# Patient Record
Sex: Male | Born: 1970 | Race: Black or African American | Hispanic: No | Marital: Single | Smoking: Current every day smoker
Health system: Southern US, Community
[De-identification: ages and names within clinical notes are randomized; demographics above are authoritative.]

## PROBLEM LIST (undated history)

## (undated) DIAGNOSIS — F419 Anxiety disorder, unspecified: Secondary | ICD-10-CM

## (undated) DIAGNOSIS — I10 Essential (primary) hypertension: Secondary | ICD-10-CM

## (undated) HISTORY — PX: OTHER SURGICAL HISTORY: SHX169

---

## 2018-04-16 ENCOUNTER — Inpatient Hospital Stay
Admission: EM | Admit: 2018-04-16 | Discharge: 2018-04-19 | DRG: 897 | Disposition: A | Discharge status: Home / Self Care | Payer: Self-pay | Attending: Internal Medicine | Admitting: Internal Medicine

## 2018-04-16 ENCOUNTER — Other Ambulatory Visit: Payer: Self-pay

## 2018-04-16 DIAGNOSIS — F10231 Alcohol dependence with withdrawal delirium: Principal | ICD-10-CM | POA: Diagnosis present

## 2018-04-16 DIAGNOSIS — F17290 Nicotine dependence, other tobacco product, uncomplicated: Secondary | ICD-10-CM | POA: Diagnosis present

## 2018-04-16 DIAGNOSIS — Z79899 Other long term (current) drug therapy: Secondary | ICD-10-CM

## 2018-04-16 DIAGNOSIS — F10939 Alcohol use, unspecified with withdrawal, unspecified: Secondary | ICD-10-CM

## 2018-04-16 DIAGNOSIS — I10 Essential (primary) hypertension: Secondary | ICD-10-CM | POA: Diagnosis present

## 2018-04-16 DIAGNOSIS — R000 Tachycardia, unspecified: Secondary | ICD-10-CM | POA: Diagnosis present

## 2018-04-16 DIAGNOSIS — M6282 Rhabdomyolysis: Secondary | ICD-10-CM

## 2018-04-16 DIAGNOSIS — F419 Anxiety disorder, unspecified: Secondary | ICD-10-CM | POA: Diagnosis present

## 2018-04-16 DIAGNOSIS — E876 Hypokalemia: Secondary | ICD-10-CM | POA: Diagnosis present

## 2018-04-16 DIAGNOSIS — R443 Hallucinations, unspecified: Secondary | ICD-10-CM | POA: Diagnosis present

## 2018-04-16 DIAGNOSIS — F10239 Alcohol dependence with withdrawal, unspecified: Secondary | ICD-10-CM | POA: Diagnosis present

## 2018-04-16 DIAGNOSIS — Z7982 Long term (current) use of aspirin: Secondary | ICD-10-CM

## 2018-04-16 DIAGNOSIS — Z560 Unemployment, unspecified: Secondary | ICD-10-CM

## 2018-04-16 HISTORY — DX: Essential (primary) hypertension: I10

## 2018-04-16 HISTORY — DX: Anxiety disorder, unspecified: F41.9

## 2018-04-16 LAB — URINALYSIS, COMPLETE (UACMP) WITH MICROSCOPIC
BACTERIA UA: NONE SEEN
BILIRUBIN URINE: NEGATIVE
GLUCOSE, UA: NEGATIVE mg/dL
HGB URINE DIPSTICK: NEGATIVE
KETONES UR: NEGATIVE mg/dL
LEUKOCYTES UA: NEGATIVE
Nitrite: NEGATIVE
Protein, ur: NEGATIVE mg/dL
SQUAMOUS EPITHELIAL / LPF: NONE SEEN (ref 0–5)
Specific Gravity, Urine: 1.003 — ABNORMAL LOW (ref 1.005–1.030)
WBC UA: NONE SEEN WBC/hpf (ref 0–5)
pH: 6 (ref 5.0–8.0)

## 2018-04-16 LAB — URINE DRUG SCREEN, QUALITATIVE (ARMC ONLY)
Amphetamines, Ur Screen: NOT DETECTED
BARBITURATES, UR SCREEN: NOT DETECTED
Benzodiazepine, Ur Scrn: NOT DETECTED
COCAINE METABOLITE, UR ~~LOC~~: NOT DETECTED
Cannabinoid 50 Ng, Ur ~~LOC~~: NOT DETECTED
MDMA (ECSTASY) UR SCREEN: NOT DETECTED
METHADONE SCREEN, URINE: NOT DETECTED
Opiate, Ur Screen: NOT DETECTED
Phencyclidine (PCP) Ur S: NOT DETECTED
Tricyclic, Ur Screen: NOT DETECTED

## 2018-04-16 LAB — COMPREHENSIVE METABOLIC PANEL
ALBUMIN: 4.5 g/dL (ref 3.5–5.0)
ALT: 53 U/L (ref 17–63)
AST: 103 U/L — AB (ref 15–41)
Alkaline Phosphatase: 89 U/L (ref 38–126)
Anion gap: 14 (ref 5–15)
BUN: 9 mg/dL (ref 6–20)
CHLORIDE: 94 mmol/L — AB (ref 101–111)
CO2: 23 mmol/L (ref 22–32)
Calcium: 8.5 mg/dL — ABNORMAL LOW (ref 8.9–10.3)
Creatinine, Ser: 0.87 mg/dL (ref 0.61–1.24)
GFR calc Af Amer: >60 mL/min (ref >60)
GLUCOSE: 116 mg/dL — AB (ref 65–99)
POTASSIUM: 3 mmol/L — AB (ref 3.5–5.1)
Sodium: 131 mmol/L — ABNORMAL LOW (ref 135–145)
Total Bilirubin: 0.6 mg/dL (ref 0.3–1.2)
Total Protein: 8.6 g/dL — ABNORMAL HIGH (ref 6.5–8.1)

## 2018-04-16 LAB — CBC
HCT: 41.9 % (ref 40.0–52.0)
HEMOGLOBIN: 14.3 g/dL (ref 13.0–18.0)
MCH: 30.4 pg (ref 26.0–34.0)
MCHC: 34.1 g/dL (ref 32.0–36.0)
MCV: 88.9 fL (ref 80.0–100.0)
Platelets: 139 10*3/uL — ABNORMAL LOW (ref 150–440)
RBC: 4.71 MIL/uL (ref 4.40–5.90)
RDW: 15.3 % — ABNORMAL HIGH (ref 11.5–14.5)
WBC: 6.6 10*3/uL (ref 3.8–10.6)

## 2018-04-16 LAB — CK: CK TOTAL: 686 U/L — AB (ref 49–397)

## 2018-04-16 LAB — ETHANOL: ALCOHOL ETHYL (B): 67 mg/dL — AB (ref <10)

## 2018-04-16 LAB — MAGNESIUM: Magnesium: 1.6 mg/dL — ABNORMAL LOW (ref 1.7–2.4)

## 2018-04-16 MED ORDER — SODIUM CHLORIDE 0.9 % IV BOLUS
1000.0000 mL | Freq: Once | INTRAVENOUS | Status: AC
  Administered 2018-04-16: 1000 mL via INTRAVENOUS

## 2018-04-16 MED ORDER — LORAZEPAM 1 MG PO TABS: 1.0000 mg | ORAL_TABLET | Freq: Four times a day (QID) | ORAL | Status: AC | PRN

## 2018-04-16 MED ORDER — ONDANSETRON HCL 4 MG/2ML IJ SOLN: 4.0000 mg | Freq: Four times a day (QID) | INTRAMUSCULAR | Status: DC | PRN

## 2018-04-16 MED ORDER — LORAZEPAM 2 MG/ML IJ SOLN
2.0000 mg | Freq: Once | INTRAMUSCULAR | Status: AC
  Administered 2018-04-16: 2 mg via INTRAVENOUS

## 2018-04-16 MED ORDER — POTASSIUM CHLORIDE IN NACL 20-0.9 MEQ/L-% IV SOLN
INTRAVENOUS | Status: DC
  Administered 2018-04-16 – 2018-04-18 (×3): via INTRAVENOUS
  Filled 2018-04-16 (×7): qty 1000

## 2018-04-16 MED ORDER — SENNOSIDES-DOCUSATE SODIUM 8.6-50 MG PO TABS
1.0000 | ORAL_TABLET | Freq: Every evening | ORAL | Status: DC | PRN
  Administered 2018-04-18: 1 via ORAL
  Filled 2018-04-16: qty 1

## 2018-04-16 MED ORDER — ENOXAPARIN SODIUM 40 MG/0.4ML ~~LOC~~ SOLN
40.0000 mg | SUBCUTANEOUS | Status: DC
  Administered 2018-04-16 – 2018-04-18 (×3): 40 mg via SUBCUTANEOUS
  Filled 2018-04-16 (×3): qty 0.4

## 2018-04-16 MED ORDER — ACETAMINOPHEN 650 MG RE SUPP: 650.0000 mg | Freq: Four times a day (QID) | RECTAL | Status: DC | PRN

## 2018-04-16 MED ORDER — CLONIDINE HCL 0.1 MG PO TABS
0.2000 mg | ORAL_TABLET | Freq: Three times a day (TID) | ORAL | Status: DC
  Administered 2018-04-16 – 2018-04-19 (×8): 0.2 mg via ORAL
  Filled 2018-04-16 (×8): qty 2

## 2018-04-16 MED ORDER — ACETAMINOPHEN 325 MG PO TABS
650.0000 mg | ORAL_TABLET | Freq: Four times a day (QID) | ORAL | Status: DC | PRN
  Administered 2018-04-17 – 2018-04-18 (×5): 650 mg via ORAL
  Filled 2018-04-16 (×5): qty 2

## 2018-04-16 MED ORDER — HYDRALAZINE HCL 50 MG PO TABS
50.0000 mg | ORAL_TABLET | Freq: Three times a day (TID) | ORAL | Status: DC
  Administered 2018-04-16 – 2018-04-18 (×7): 50 mg via ORAL
  Filled 2018-04-16 (×7): qty 1

## 2018-04-16 MED ORDER — NICOTINE 21 MG/24HR TD PT24
21.0000 mg | MEDICATED_PATCH | Freq: Every day | TRANSDERMAL | Status: DC
  Administered 2018-04-16 – 2018-04-19 (×4): 21 mg via TRANSDERMAL
  Filled 2018-04-16 (×4): qty 1

## 2018-04-16 MED ORDER — MAGNESIUM SULFATE 2 GM/50ML IV SOLN
2.0000 g | Freq: Once | INTRAVENOUS | Status: AC
  Administered 2018-04-16: 2 g via INTRAVENOUS
  Filled 2018-04-16: qty 50

## 2018-04-16 MED ORDER — POTASSIUM CHLORIDE CRYS ER 20 MEQ PO TBCR
40.0000 meq | EXTENDED_RELEASE_TABLET | Freq: Once | ORAL | Status: AC
  Administered 2018-04-16: 40 meq via ORAL
  Filled 2018-04-16: qty 2

## 2018-04-16 MED ORDER — LORAZEPAM 2 MG/ML IJ SOLN
1.0000 mg | Freq: Four times a day (QID) | INTRAMUSCULAR | Status: AC | PRN
  Administered 2018-04-18: 1 mg via INTRAVENOUS
  Filled 2018-04-16: qty 1

## 2018-04-16 MED ORDER — LORAZEPAM 2 MG/ML IJ SOLN: 0.0000 mg | Freq: Two times a day (BID) | INTRAMUSCULAR | Status: DC

## 2018-04-16 MED ORDER — ADULT MULTIVITAMIN W/MINERALS CH
1.0000 | ORAL_TABLET | Freq: Every day | ORAL | Status: DC
  Administered 2018-04-17 – 2018-04-19 (×3): 1 via ORAL
  Filled 2018-04-16 (×3): qty 1

## 2018-04-16 MED ORDER — CHLORDIAZEPOXIDE HCL 25 MG PO CAPS
25.0000 mg | ORAL_CAPSULE | Freq: Three times a day (TID) | ORAL | Status: DC
  Administered 2018-04-16 – 2018-04-19 (×9): 25 mg via ORAL
  Filled 2018-04-16 (×11): qty 1

## 2018-04-16 MED ORDER — LABETALOL HCL 5 MG/ML IV SOLN
20.0000 mg | INTRAVENOUS | Status: DC | PRN
  Administered 2018-04-16 – 2018-04-19 (×4): 20 mg via INTRAVENOUS
  Filled 2018-04-16 (×5): qty 4

## 2018-04-16 MED ORDER — LORAZEPAM 2 MG/ML IJ SOLN
INTRAMUSCULAR | Status: AC
  Filled 2018-04-16: qty 1

## 2018-04-16 MED ORDER — NIFEDIPINE ER OSMOTIC RELEASE 30 MG PO TB24: 30.0000 mg | ORAL_TABLET | Freq: Every day | ORAL | Status: DC

## 2018-04-16 MED ORDER — THIAMINE HCL 100 MG/ML IJ SOLN
100.0000 mg | Freq: Once | INTRAMUSCULAR | Status: AC
  Administered 2018-04-16: 100 mg via INTRAVENOUS
  Filled 2018-04-16: qty 2

## 2018-04-16 MED ORDER — LABETALOL HCL 5 MG/ML IV SOLN
INTRAVENOUS | Status: AC
  Administered 2018-04-16: 20 mg
  Filled 2018-04-16: qty 4

## 2018-04-16 MED ORDER — LORAZEPAM 2 MG/ML IJ SOLN
1.0000 mg | Freq: Once | INTRAMUSCULAR | Status: AC
  Administered 2018-04-16: 1 mg via INTRAVENOUS
  Filled 2018-04-16: qty 1

## 2018-04-16 MED ORDER — ONDANSETRON HCL 4 MG PO TABS: 4.0000 mg | ORAL_TABLET | Freq: Four times a day (QID) | ORAL | Status: DC | PRN

## 2018-04-16 MED ORDER — LORAZEPAM 2 MG/ML IJ SOLN
0.0000 mg | Freq: Four times a day (QID) | INTRAMUSCULAR | Status: AC
  Filled 2018-04-16: qty 1

## 2018-04-16 MED ORDER — THIAMINE HCL 100 MG/ML IJ SOLN
Freq: Once | INTRAVENOUS | Status: AC
  Administered 2018-04-16: 15:00:00 via INTRAVENOUS
  Filled 2018-04-16: qty 1000

## 2018-04-16 MED ORDER — SODIUM CHLORIDE 0.9 % IV SOLN
1.0000 mg | Freq: Once | INTRAVENOUS | Status: AC
  Administered 2018-04-16: 1 mg via INTRAVENOUS
  Filled 2018-04-16: qty 0.2

## 2018-04-16 NOTE — BH Assessment (Signed)
Assessment Note  Evan Ramsey is an 47 y.o. male who presents to the ER due to concerns about physical symptoms. He states when he went to the bathroom, "I start feeling dizzy, seeing stars and my left arm start feeling tingy." He further reports "my mother checked my blood pressure with her machine and it was low."   Patient reports of having history of alcohol use and when he lost his job, approximately a week ago, the amount increased. He was drinking "a can (24oz)" a day, now he is drinking a "twelve pack a day." Patient also reports of having a history of anxiety and his PCP prescribes medications for it. He was unable to quantify the frequency of the panic attacks. He voiced no concerns about the anxiety.  During the interview, the patient was calm, cooperative. He was able to provide appropriate answers to the questions. Throughout the assessment he denied SI/HI and AV/H. He reports of having no history of outpatient or inpatient treatment. He denies involvement with the legal system and have no history of violence of aggression.  Diagnosis: Alcohol Use Disorder  Past Medical History:  Past Medical History:  Diagnosis Date  . Anxiety   . Hypertension     History reviewed. No pertinent surgical history.  Family History: History reviewed. No pertinent family history.  Social History:  reports that he has been smoking.  He has never used smokeless tobacco. He reports that he drinks alcohol. His drug history is not on file.  Additional Social History:  Alcohol / Drug Use Pain Medications: See PTA Prescriptions: See PTA Over the Counter: See PTA History of alcohol / drug use?: Yes Longest period of sobriety (when/how long): Unable to quantify  Negative Consequences of Use: (reports of none ) Substance #1 Name of Substance 1: Alcohol 1 - Age of First Use: Unknown 1 - Amount (size/oz): For the last week, 12 pack of beers 1 - Frequency: Daily 1 - Duration: For approximately a  week 1 - Last Use / Amount: 04/15/2018  CIWA: CIWA-Ar BP: (!) 164/106 Pulse Rate: (!) 110 COWS:    Allergies: No Known Allergies  Home Medications:  (Not in a hospital admission)  OB/GYN Status:  No LMP for male patient.  General Assessment Data Location of Assessment: [facility] ED TTS Assessment: In system Is this a Tele or Face-to-Face Assessment?: Face-to-Face Is this an Initial Assessment or a Re-assessment for this encounter?: Initial Assessment Marital status: Divorced [place] name: n/a Is patient pregnant?: No Pregnancy Status: No Living Arrangements: Alone Can pt return to current living arrangement?: Yes Admission Status: Voluntary Is patient capable of signing voluntary admission?: Yes Referral Source: Self/Family/Friend Insurance type: None  Medical Screening Exam [facility] Walk-in ONLY) Medical Exam completed: Yes  Crisis Care Plan Living Arrangements: Alone Legal Guardian: Other:(Self) Name of Psychiatrist: Reports of none Name of Therapist: Reports of none  Education Status Is patient currently in school?: No Is the patient employed, unemployed or receiving disability?: Unemployed  Risk to self with the past 6 months Suicidal Ideation: No Has patient been a risk to self within the past 6 months prior to admission? : No Suicidal Intent: No Has patient had any suicidal intent within the past 6 months prior to admission? : No Is patient at risk for suicide?: No Suicidal Plan?: No Has patient had any suicidal plan within the past 6 months prior to admission? : No Access to Means: No What has been your use of drugs/alcohol within the last 12 months?:  Alcohol Previous Attempts/Gestures: No How many times?: 0 Other Self Harm Risks: Active Addiction Triggers for Past Attempts: None known Intentional Self Injurious Behavior: None Family Suicide History: No Recent stressful life event(s): Job Loss Persecutory voices/beliefs?: No Depression: Yes Depression  Symptoms: Isolating, Loss of interest in usual pleasures Substance abuse history and/or treatment for substance abuse?: Yes Suicide prevention information given to non-admitted patients: Not applicable  Risk to Others within the past 6 months Homicidal Ideation: No Does patient have any lifetime risk of violence toward others beyond the six months prior to admission? : No Thoughts of Harm to Others: No Current Homicidal Intent: No Current Homicidal Plan: No Access to Homicidal Means: No Identified Victim: Reports of none History of harm to others?: No Assessment of Violence: None Noted Violent Behavior Description: Reports of none Does patient have access to weapons?: No Criminal Charges Pending?: No Does patient have a court date: No Is patient on probation?: No  Psychosis Hallucinations: None noted Delusions: None noted  Mental Status Report Appearance/Hygiene: Unremarkable, In scrubs Eye Contact: Fair Motor Activity: Freedom of movement, Unremarkable Speech: Logical/coherent Level of Consciousness: Alert Mood: Anxious, Pleasant Affect: Appropriate to circumstance Anxiety Level: Minimal Thought Processes: Coherent, Relevant Judgement: Unimpaired Orientation: Person, Place, Time, Situation, Appropriate for developmental age Obsessive Compulsive Thoughts/Behaviors: Minimal  Cognitive Functioning Concentration: Normal Memory: Recent Intact, Remote Intact Is patient IDD: No Is patient DD?: No Insight: Fair Impulse Control: Fair Appetite: Good Have you had any weight changes? : No Change Sleep: No Change Total Hours of Sleep: 8 Vegetative Symptoms: None  ADLScreening [facility] Assessment Services) Patient's cognitive ability adequate to safely complete daily activities?: Yes Patient able to express need for assistance with ADLs?: Yes Independently performs ADLs?: Yes (appropriate for developmental age)  Prior Inpatient Therapy Prior Inpatient Therapy: No  Prior  Outpatient Therapy Prior Outpatient Therapy: No Does patient have an ACCT team?: No Does patient have Intensive In-House Services?  : No Does patient have [facility] services? : No Does patient have P4CC services?: No  ADL Screening (condition at time of admission) Patient's cognitive ability adequate to safely complete daily activities?: Yes Is the patient deaf or have difficulty hearing?: No Does the patient have difficulty seeing, even when wearing glasses/contacts?: No Does the patient have difficulty concentrating, remembering, or making decisions?: No Patient able to express need for assistance with ADLs?: Yes Does the patient have difficulty dressing or bathing?: No Independently performs ADLs?: Yes (appropriate for developmental age) Does the patient have difficulty walking or climbing stairs?: No Weakness of Legs: None Weakness of Arms/Hands: None  Home Assistive Devices/Equipment Home Assistive Devices/Equipment: None  Therapy Consults (therapy consults require a physician order) PT Evaluation Needed: No OT Evalulation Needed: No SLP Evaluation Needed: No Abuse/Neglect Assessment (Assessment to be complete while patient is alone) Abuse/Neglect Assessment Can Be Completed: Yes Physical Abuse: Denies Verbal Abuse: Denies Sexual Abuse: Denies Exploitation of patient/patient's resources: Denies Self-Neglect: Denies Values / Beliefs Cultural Requests During Hospitalization: None Spiritual Requests During Hospitalization: None Consults Spiritual Care Consult Needed: No Social Work Consult Needed: No         Child/Adolescent Assessment Running Away Risk: Denies(Patient is an adult)  Disposition:  Disposition Initial Assessment Completed for this Encounter: Yes  On Site Evaluation by:   Reviewed with Physician:    Lilyan Gilfordalvin J. Thaine Garriga MS, LCAS, LPC, NCC, CCSI Therapeutic Triage Specialist 04/16/2018 10:54 AM

## 2018-04-16 NOTE — Progress Notes (Signed)
Pt received PO hydralazine at 1pm, BP still elevated, so PRN IV labetalol given at 1615. BP 151/112 after dose given. Dr. Tobi BastosPyreddy notified again. Received order to start PO clonidine q8 and to d/c home nifedipine. Pt updated.  EmdenHudson, Evan Ramsey

## 2018-04-16 NOTE — Progress Notes (Signed)
Pt admitted to room 150 from the ED. Pt is A&Ox4, unsteady when ambulates and has tremors. [name] BP still elevated, 155/119, even after IV labetalol given in the ED. Pt denies any symptoms right now. Dr. Tobi BastosPyreddy notified and received order for hydralazine 50mg  q8. Medication given, will assess effectiveness. Pt oriented to room and plan of care.   SelfridgeHudson, Latricia HeftKorie G

## 2018-04-16 NOTE — BH Assessment (Signed)
Discussed patient with ER MD (Dr. Alphonzo LemmingsMcShane) and patient is able to discharge home when medically cleared. Patient was giving referral information and instructions on how to follow up with Outpatient Treatment ([facility]) and [facility].  Patient denies SI/HI and AV/H.  Patient gave Clinical research associatewriter verbal permission to forward his information to [facility] Peer Support Worker Lorella Nimrod(Harvey B.), to have him follow-up with him.

## 2018-04-16 NOTE — ED Triage Notes (Signed)
Pt presents via EMS c/o "anxiety" this am. Reports lost job x1 week ago and has been "drinking and stressing". Pt reports hx of withdrawal from alcohol, denies hx seizures. Tremors noted.

## 2018-04-16 NOTE — Progress Notes (Signed)
   04/16/18 1305  Clinical Encounter Type  Visited With Patient  Visit Type Initial  Referral From Nurse  Consult/Referral To Chaplain  Spiritual Encounters  Spiritual Needs Brochure   CH received an OR to educate the PT on a AD. CH left paperwork with PT and will follow up when PT is ready.

## 2018-04-16 NOTE — ED Notes (Signed)
Requested anti-hypertensive medication from Pyreddy MD per floor RN request. Pending orders.

## 2018-04-16 NOTE — ED Notes (Signed)
TTS at bedside. 

## 2018-04-16 NOTE — Progress Notes (Signed)
Advanced care plan. Purpose of the Encounter: CODE STATUS Parties in Attendance:Patient and family Patient's Decision Capacity:Good Subjective/Patient's story: Presented to the emergency room with shakiness and anxiety Objective/Medical story Has alcohol withdrawal Goals of care determination:  Advanced directives discussed with patient and family Goals of care discussed Patient and family want everything done which includes cardiac resuscitation, intubation and ventilator of the need arises CODE STATUS: Full code Time spent discussing advanced care planning: 16 minutes

## 2018-04-16 NOTE — ED Provider Notes (Addendum)
[facility] Emergency Department Provider Note  ____________________________________________   I have reviewed the triage vital signs and the nursing notes. Where available I have reviewed prior notes and, if possible and indicated, outside hospital notes.    HISTORY  Chief Complaint Anxiety and Tremors    HPI Evan Ramsey is a 47 y.o. male who presents today complaining of feeling very anxious.  Has a long history of anxiety.  Denies any SI or or HI denies cocaine abuse.  States he lost his job a week ago, states since that time he is been drinking more because of his nerves.  He had a 12 pack yesterday, and this morning he woke up and felt very anxious.  He is never had alcohol withdrawal or a withdrawal seizure that he knows about.  He denies any chest pain shortness of breath nausea vomiting or diarrhea.  He just states he feels anxious.    Past Medical History:  Diagnosis Date  . Anxiety   . Hypertension     There are no active problems to display for this patient.   History reviewed. No pertinent surgical history.  Prior to Admission medications   Not on File    Allergies Patient has no known allergies.  History reviewed. No pertinent family history.  Social History Social History   Tobacco Use  . Smoking status: Current Every Day Smoker  . Smokeless tobacco: Never Used  Substance Use Topics  . Alcohol use: Yes  . Drug use: Not on file    Review of Systems Constitutional: No fever/chills Eyes: No visual changes. ENT: No sore throat. No stiff neck no neck pain Cardiovascular: Denies chest pain. Respiratory: Denies shortness of breath. Gastrointestinal:   no vomiting.  No diarrhea.  No constipation. Genitourinary: Negative for dysuria. Musculoskeletal: Negative lower extremity swelling Skin: Negative for rash. Neurological: Negative for severe headaches, focal weakness or  numbness.   ____________________________________________   PHYSICAL EXAM:  VITAL SIGNS: ED Triage Vitals  Enc Vitals Group     BP --      Pulse Rate 04/16/18 0749 (!) 126     Resp 04/16/18 0749 20     Temp 04/16/18 0749 98.3 F (36.8 C)     Temp Source 04/16/18 0749 Oral     SpO2 04/16/18 0749 99 %     Weight 04/16/18 0751 174 lb (78.9 kg)     Height 04/16/18 0751 5\' 10"  (1.778 m)     Head Circumference --      Peak Flow --      Pain Score 04/16/18 0751 0     Pain Loc --      Pain Edu? --      Excl. in GC? --     Constitutional: Alert and oriented.  he is quite anxious.  Has some degree of psychomotor agitation Eyes: Conjunctivae are normal Head: Atraumatic HEENT: No congestion/rhinnorhea. Mucous membranes are moist.  Oropharynx non-erythematous Neck:   Nontender with no meningismus, no masses, no stridor Cardiovascular: Tachycardia noted grossly normal heart sounds.  Good peripheral circulation. Respiratory: Normal respiratory effort.  No retractions. Lungs CTAB. Abdominal: Soft and nontender. No distention. No guarding no rebound Back:  There is no focal tenderness or step off.  there is no midline tenderness there are no lesions noted. there is no CVA tenderness Musculoskeletal: No lower extremity tenderness, no upper extremity tenderness. No joint effusions, no DVT signs strong distal pulses no edema Neurologic:  Normal speech and language. No gross  focal neurologic deficits are appreciated.  Skin:  Skin is warm, dry and intact. No rash noted. Psychiatric: Mood and affect are anxious. Speech and behavior are normal.  ____________________________________________   LABS (all labs ordered are listed, but only abnormal results are displayed)  Labs Reviewed  COMPREHENSIVE METABOLIC PANEL  ETHANOL  CBC  URINE DRUG SCREEN, QUALITATIVE ([facility] ONLY)  URINALYSIS, COMPLETE ([facility]) WITH MICROSCOPIC  CK    Pertinent labs  results that were available during my care of the  patient were reviewed by me and considered in my medical decision making (see chart for details). ____________________________________________  EKG  I personally interpreted any EKGs ordered by me or triage  ____________________________________________  RADIOLOGY  Pertinent labs & imaging results that were available during my care of the patient were reviewed by me and considered in my medical decision making (see chart for details). If possible, patient and/or family made aware of any abnormal findings.  No results found. ____________________________________________    PROCEDURES  Procedure(s) performed: None  Procedures  Critical Care performed: None  ____________________________________________   INITIAL IMPRESSION / ASSESSMENT AND PLAN / ED COURSE  Pertinent labs & imaging results that were available during my care of the patient were reviewed by me and considered in my medical decision making (see chart for details).  Here with anxiety and tachycardia all of which could simply be elated to his anxiety although he does also drink a great deal of alcohol a regular basis and more over the last week and there is some question as to whether he may be also in concomitant alcohol withdrawal.  We will give him thiamine folate Ativan and IV fluids, check basic blood work including a total CK, and we will give him thiamine and folate, he has no thoughts of hurting himself or anyone else however he is quite anxious.  We will see if we can get a TTS consult as well  ----------------------------------------- 8:37 AM on 04/16/2018 ----------------------------------------- he is much less anxious and he is more relaxed.  Heart rate is still somewhat up and he is still somewhat jittery, however, definitely an improvement after Ativan.  He states he used to be on benzos but he would take too many, Xanax he believes, so he was switched to Ativan but is not had any of that for at least a  month he states.  No evidence therefore of benzo withdrawal by history.  He does take a blood pressure medication is not sure of the name he took it at 2:00 this morning when he started to feel "weird and anxious".  No other medications and no overdose no SI no HI   ----------------------------------------- 9:33 AM on 04/16/2018 -----------------------------------------  His tachycardia starting to come down we are giving him more Ativan, heart rate is 113 at this time, he is in mild rhabdo, and he does have hypo-magnesium hypokalemia, I do not think this is something I am going to be able to fix in the emergency department.  Will admit for further care.   ____________________________________________   FINAL CLINICAL IMPRESSION(S) / ED DIAGNOSES  Final diagnoses:  None      This chart was dictated using voice recognition software.  Despite best efforts to proofread,  errors can occur which can change meaning.      Jeanmarie PlantMcShane, Lancelot Alyea A, MD 04/16/18 [id number]    Jeanmarie PlantMcShane, Hayden Mabin A, MD 04/16/18 [id number]    Jeanmarie PlantMcShane, Missael Ferrari A, MD 04/16/18 [phone]

## 2018-04-16 NOTE — ED Notes (Signed)
ED Provider at bedside. 

## 2018-04-16 NOTE — H&P (Signed)
[facility] [place] [facility]   PATIENT NAME: Evan Ramsey Ramsey    MR#:  [id number]  DATE OF BIRTH:  06/24/1971  DATE OF ADMISSION:  04/16/2018  PRIMARY CARE PHYSICIAN: [facility]   REQUESTING/REFERRING PHYSICIAN:   CHIEF COMPLAINT:   Chief Complaint  Patient presents with  . Anxiety  . Tremors    HISTORY OF PRESENT ILLNESS: Evan Ramsey Ghosh  is a 47 y.o. male with a known history of alcohol abuse, hypertension, anxiety disorder recently lost  his job 2 weeks ago.  Patient has been drinking alcohol heavily from them.  He had a lot of anxiety and tremors and came to the emergency room.  Sometimes has hallucinations and seeing things.  No complaints of any chest pain, shortness of breath.  No thoughts of hurting himself or hurting others.  Patient's potassium level is low and was given thiamine and folic acid in the emergency room.  Hospitalist service was consulted for alcohol withdrawal.  PAST MEDICAL HISTORY:   Past Medical History:  Diagnosis Date  . Anxiety   . Hypertension     PAST SURGICAL HISTORY:  Past Surgical History:  Procedure Laterality Date  . none      SOCIAL HISTORY:  Social History   Tobacco Use  . Smoking status: Current Every Day Smoker    Types: Cigars  . Smokeless tobacco: Never Used  Substance Use Topics  . Alcohol use: Yes    FAMILY HISTORY:  Family History  Problem Relation Age of Onset  . Hypertension Mother   . Anxiety disorder Mother     DRUG ALLERGIES: No Known Allergies  REVIEW OF SYSTEMS:   CONSTITUTIONAL: No fever, fatigue or weakness.  EYES: No blurred or double vision.  EARS, NOSE, AND THROAT: No tinnitus or ear pain.  RESPIRATORY: No cough, shortness of breath, wheezing or hemoptysis.  CARDIOVASCULAR: No chest pain, orthopnea, edema.  GASTROINTESTINAL: No nausea, vomiting, diarrhea or abdominal pain.  GENITOURINARY: No dysuria, hematuria.  ENDOCRINE: No polyuria, nocturia,   HEMATOLOGY: No anemia, easy bruising or bleeding SKIN: No rash or lesion. MUSCULOSKELETAL: No joint pain or arthritis.   NEUROLOGIC: No tingling, numbness, weakness.  Has tremors upper extremities PSYCHIATRY: Has anxiety  MEDICATIONS AT HOME:  Prior to Admission medications   Medication Sig Start Date End Date Taking? Authorizing Provider  aspirin 325 MG tablet Take 325 mg by mouth daily.   Yes [provider]  NIFEdipine (PROCARDIA-XL/ADALAT CC) 30 MG 24 hr tablet Take 30 mg by mouth daily.   Yes [provider]      PHYSICAL EXAMINATION:   VITAL SIGNS: Blood pressure (!) 164/106, pulse (!) 110, temperature 98.3 F (36.8 C), temperature source Oral, resp. rate 15, height 5\' 10"  (1.778 m), weight 78.9 kg (174 lb), SpO2 96 %.  GENERAL:  47 y.o.-year-old patient lying in the bed with no acute distress.  EYES: Pupils equal, round, reactive to light and accommodation. No scleral icterus. Extraocular muscles intact.  HEENT: Head atraumatic, normocephalic. Oropharynx dry and nasopharynx clear.  NECK:  Supple, no jugular venous distention. No thyroid enlargement, no tenderness.  LUNGS: Normal breath sounds bilaterally, no wheezing, rales,rhonchi or crepitation. No use of accessory muscles of respiration.  CARDIOVASCULAR: S1, S2 normal. No murmurs, rubs, or gallops.  ABDOMEN: Soft, nontender, nondistended. Bowel sounds present. No organomegaly or mass.  EXTREMITIES: No pedal edema, cyanosis, or clubbing.  Tremors upper extremities NEUROLOGIC: Cranial nerves II through XII are intact. Muscle strength 5/5 in all  extremities. Sensation intact. Gait not checked.  PSYCHIATRIC: The patient is alert and oriented x 3.  SKIN: No obvious rash, lesion, or ulcer.   LABORATORY PANEL:   CBC Recent Labs  Lab 04/16/18 0800  WBC 6.6  HGB 14.3  HCT 41.9  PLT 139*  MCV 88.9  MCH 30.4  MCHC 34.1  RDW 15.3*    ------------------------------------------------------------------------------------------------------------------  Chemistries  Recent Labs  Lab 04/16/18 0800  NA 131*  K 3.0*  CL 94*  CO2 23  GLUCOSE 116*  BUN 9  CREATININE 0.87  CALCIUM 8.5*  MG 1.6*  AST 103*  ALT 53  ALKPHOS 89  BILITOT 0.6   ------------------------------------------------------------------------------------------------------------------ estimated creatinine clearance is 110.7 mL/min (by C-G formula based on SCr of 0.87 mg/dL). ------------------------------------------------------------------------------------------------------------------ No results for input(s): TSH, T4TOTAL, T3FREE, THYROIDAB in the last 72 hours.  Invalid input(s): FREET3   Coagulation profile No results for input(s): INR, PROTIME in the last 168 hours. ------------------------------------------------------------------------------------------------------------------- No results for input(s): DDIMER in the last 72 hours. -------------------------------------------------------------------------------------------------------------------  Cardiac Enzymes No results for input(s): CKMB, TROPONINI, MYOGLOBIN in the last 168 hours.  Invalid input(s): CK ------------------------------------------------------------------------------------------------------------------ Invalid input(s): POCBNP  ---------------------------------------------------------------------------------------------------------------  Urinalysis    Component Value Date/Time   COLORURINE COLORLESS (A) 04/16/2018 0814   APPEARANCEUR CLEAR (A) 04/16/2018 0814   LABSPEC 1.003 (L) 04/16/2018 0814   PHURINE 6.0 04/16/2018 0814   GLUCOSEU NEGATIVE 04/16/2018 0814   HGBUR NEGATIVE 04/16/2018 0814   BILIRUBINUR NEGATIVE 04/16/2018 0814   KETONESUR NEGATIVE 04/16/2018 0814   PROTEINUR NEGATIVE 04/16/2018 0814   NITRITE NEGATIVE 04/16/2018 0814   LEUKOCYTESUR  NEGATIVE 04/16/2018 0814     RADIOLOGY: No results found.  EKG: Orders placed or performed during the hospital encounter of 04/16/18  . ED EKG  . ED EKG    IMPRESSION AND PLAN:  47 year old male patient with history of alcohol abuse, hypertension, anxiety disorder presented to the emergency room with increased anxiety and tremors.  -Alcohol withdrawal/delirium tremens ciwa protocol Start patient on oral Librium Thiamine and folic acid supplementation IV banana bag  -Acute hypokalemia Replace potassium intravenously in the fluids  -Acute hypomagnesemia Replace magnesium intravenously  -Tobacco abuse Tobacco cessation counseled to the patient for 6 minutes Nicotine patch offered  -Anxiety disorder Anxiolytics  -Hypertension Resume nifedipine  -Substance abuse counseling given to the patient  -DVT prophylaxis subcu Lovenox daily  All the records are reviewed and case discussed with ED provider. Management plans discussed with the patient, family and they are in agreement.  CODE STATUS:Full code    TOTAL TIME TAKING CARE OF THIS PATIENT: 51 minutes.    Ihor Austin M.D on 04/16/2018 at 11:12 AM  Between 7am to 6pm - Pager - [phone]  After 6pm go to [email or web address] - password [email or web address] [facility]  [place]  Hospitalists  Office  [phone]  CC: Primary care physician; Services, [facility]

## 2018-04-17 LAB — BASIC METABOLIC PANEL
Anion gap: 8 (ref 5–15)
BUN: 9 mg/dL (ref 6–20)
CHLORIDE: 104 mmol/L (ref 101–111)
CO2: 26 mmol/L (ref 22–32)
CREATININE: 0.9 mg/dL (ref 0.61–1.24)
Calcium: 8.8 mg/dL — ABNORMAL LOW (ref 8.9–10.3)
GFR calc Af Amer: >60 mL/min (ref >60)
GFR calc non Af Amer: >60 mL/min (ref >60)
GLUCOSE: 100 mg/dL — AB (ref 65–99)
Potassium: 4.2 mmol/L (ref 3.5–5.1)
SODIUM: 138 mmol/L (ref 135–145)

## 2018-04-17 LAB — CBC
HCT: 39.4 % — ABNORMAL LOW (ref 40.0–52.0)
Hemoglobin: 13.5 g/dL (ref 13.0–18.0)
MCH: 30.6 pg (ref 26.0–34.0)
MCHC: 34.2 g/dL (ref 32.0–36.0)
MCV: 89.6 fL (ref 80.0–100.0)
PLATELETS: 112 10*3/uL — AB (ref 150–440)
RBC: 4.4 MIL/uL (ref 4.40–5.90)
RDW: 15.4 % — AB (ref 11.5–14.5)
WBC: 3.5 10*3/uL — AB (ref 3.8–10.6)

## 2018-04-17 LAB — MAGNESIUM: Magnesium: 2 mg/dL (ref 1.7–2.4)

## 2018-04-17 NOTE — Progress Notes (Signed)
[facility] - [place] at [facility]   PATIENT NAME: Evan Ramsey    MR#:  [id number]  DATE OF BIRTH:  09-23-1971  SUBJECTIVE: Admitted for alcohol withdrawal symptoms.  Also found to have severe hypokalemia, hypomagnesemia.  Received IV fluids with potassium.  CHIEF COMPLAINT:   Chief Complaint  Patient presents with  . Anxiety  . Tremors   Patient denies any tremors, hallucinations. REVIEW OF SYSTEMS:   ROS CONSTITUTIONAL: No fever, fatigue or weakness.  EYES: No blurred or double vision.  EARS, NOSE, AND THROAT: No tinnitus or ear pain.  RESPIRATORY: No cough, shortness of breath, wheezing or hemoptysis.  CARDIOVASCULAR: No chest pain, orthopnea, edema.  GASTROINTESTINAL: No nausea, vomiting, diarrhea or abdominal pain.  GENITOURINARY: No dysuria, hematuria.  ENDOCRINE: No polyuria, nocturia,  HEMATOLOGY: No anemia, easy bruising or bleeding SKIN: No rash or lesion. MUSCULOSKELETAL: No joint pain or arthritis.   NEUROLOGIC: No tingling, numbness, weakness.  PSYCHIATRY: No anxiety or depression.   DRUG ALLERGIES:  No Known Allergies  VITALS:  Blood pressure (!) 141/89, pulse 81, temperature 97.9 F (36.6 C), temperature source Oral, resp. rate 16, height 5\' 10"  (1.778 m), weight 78.9 kg (174 lb), SpO2 100 %.  PHYSICAL EXAMINATION:  GENERAL:  47 y.o.-year-old patient lying in the bed with no acute distress.  EYES: Pupils equal, round, reactive to light and accommodation. No scleral icterus. Extraocular muscles intact.  HEENT: Head atraumatic, normocephalic. Oropharynx and nasopharynx clear.  NECK:  Supple, no jugular venous distention. No thyroid enlargement, no tenderness.  LUNGS: Normal breath sounds bilaterally, no wheezing, rales,rhonchi or crepitation. No use of accessory muscles of respiration.  CARDIOVASCULAR: S1, S2 normal. No murmurs, rubs, or gallops.  ABDOMEN: Soft, nontender, nondistended. Bowel sounds present. No organomegaly or  mass.  EXTREMITIES: No pedal edema, cyanosis, or clubbing.  NEUROLOGIC: Cranial nerves II through XII are intact. Muscle strength 5/5 in all extremities. Sensation intact. Gait not checked.  PSYCHIATRIC: The patient is alert and oriented x 3.  SKIN: No obvious rash, lesion, or ulcer.    LABORATORY PANEL:   CBC Recent Labs  Lab 04/17/18 0551  WBC 3.5*  HGB 13.5  HCT 39.4*  PLT 112*   ------------------------------------------------------------------------------------------------------------------  Chemistries  Recent Labs  Lab 04/16/18 0800 04/17/18 0551  NA 131* 138  K 3.0* 4.2  CL 94* 104  CO2 23 26  GLUCOSE 116* 100*  BUN 9 9  CREATININE 0.87 0.90  CALCIUM 8.5* 8.8*  MG 1.6* 2.0  AST 103*  --   ALT 53  --   ALKPHOS 89  --   BILITOT 0.6  --    ------------------------------------------------------------------------------------------------------------------  Cardiac Enzymes No results for input(s): TROPONINI in the last 168 hours. ------------------------------------------------------------------------------------------------------------------  RADIOLOGY:  No results found.  EKG:   Orders placed or performed during the hospital encounter of 04/16/18  . ED EKG  . ED EKG    ASSESSMENT AND PLAN:   Alcohol withdrawal: Patient had anxiety, tremors when he came.  Continue CIWA protocol, monitor closely for delirium tremens.  Continue thiamine, folic acid/patient has no nausea or vomiting or hallucinations . 2.  Severe hypokalemia, hypomagnesemia: Replace potassium, magnesium, both are improved today.  #3 essential hypertension, uncontrolled yesterday but  better today. All the records are reviewed and case discussed with Care Management/Social Workerr. Management plans discussed with the patient, family and they are in agreement.  CODE STATUS: Full code  TOTAL TIME TAKING CARE OF THIS PATIENT: 35 minutes.   POSSIBLE  D/C IN DUE to DAYS, DEPENDING ON  CLINICAL CONDITION.   Katha HammingSnehalatha Thelda Gagan M.D on 04/17/2018 at 11:33 AM  Between 7am to 6pm - Pager - [phone]  After 6pm go to [email or web address] - password [email or web address] [facility]  [place] [facility] [place] [facility]  Office  [phone]  CC: Primary care physician; Services, [facility]   Note: This dictation was prepared with Dragon dictation along with smaller phrase technology. Any transcriptional errors that result from this process are unintentional.

## 2018-04-17 NOTE — Care Management Note (Signed)
Case Management Note  Patient Details  Name: Lewis MoccasinWilburn Sauser MRN: [id number] Date of Birth: 12/16/1970  Subjective/Objective:      Pateint admitted from home with his mother Cory MunchCassandra Mccray [phone]. Paer patient he is independent and has no physcial needs. Requires no DME. No transportation issues. Patient without insurance. Follows at [facility] for Doctor visits and medications. Receives medications at the clinic without issue. No falls or weight loss.                Action/Plan:  If patient has problems obtaining new [facility] RNCM may be able to provide resources.  Expected Discharge Date:                  Expected Discharge Plan:     In-House Referral:     Discharge planning Services     Post Acute Care Choice:    Choice offered to:     DME Arranged:    DME Agency:     HH Arranged:    HH Agency:     Status of Service:     If discussed at [employer] Meetings, dates discussed:    Additional Comments:  Sumner Kirchman A, RN 04/17/2018, 2:30 PM

## 2018-04-17 NOTE — Progress Notes (Signed)
Labetalol 20 mg given for blood pressure 154/95

## 2018-04-18 MED ORDER — HYDRALAZINE HCL 25 MG PO TABS: 25.0000 mg | ORAL_TABLET | Freq: Four times a day (QID) | ORAL | 0 refills | Status: DC

## 2018-04-18 MED ORDER — IRBESARTAN 75 MG PO TABS
37.5000 mg | ORAL_TABLET | Freq: Every day | ORAL | Status: DC
  Administered 2018-04-18 – 2018-04-19 (×2): 37.5 mg via ORAL
  Filled 2018-04-18 (×3): qty 0.5

## 2018-04-18 MED ORDER — CLONIDINE HCL 0.2 MG PO TABS: 0.2000 mg | ORAL_TABLET | Freq: Three times a day (TID) | ORAL | 1 refills | Status: DC

## 2018-04-18 NOTE — Progress Notes (Signed)
[facility] [place] [facility]   PATIENT NAME: Evan MoccasinWilburn Ramsey    MR#:  [id number]  DATE OF BIRTH:  04/02/1971  SUBJECTIVE: Admitted for alcohol withdrawal symptoms.  Also found to have severe hypokalemia, hypomagnesemia.  Patient with elevated blood pressure still blood pressure is high at 157/100 likely discharge home tomorrow add diuretic instead of hydralazine because of hyperkalemia is making him nauseous  CHIEF COMPLAINT:   Chief Complaint  Patient presents with  . Anxiety  . Tremors   Patient denies any tremors, hallucinations. REVIEW OF SYSTEMS:   ROS CONSTITUTIONAL: No fever, fatigue or weakness.  EYES: No blurred or double vision.  EARS, NOSE, AND THROAT: No tinnitus or ear pain.  RESPIRATORY: No cough, shortness of breath, wheezing or hemoptysis.  CARDIOVASCULAR: No chest pain, orthopnea, edema.  GASTROINTESTINAL: No nausea, vomiting, diarrhea or abdominal pain.  GENITOURINARY: No dysuria, hematuria.  ENDOCRINE: No polyuria, nocturia,  HEMATOLOGY: No anemia, easy bruising or bleeding SKIN: No rash or lesion. MUSCULOSKELETAL: No joint pain or arthritis.   NEUROLOGIC: No tingling, numbness, weakness.  PSYCHIATRY: No anxiety or depression.   DRUG ALLERGIES:  No Known Allergies  VITALS:  Blood pressure (!) 157/100, pulse 91, temperature 99.2 F (37.3 C), temperature source Oral, resp. rate 18, height 5\' 10"  (1.778 m), weight 78.9 kg (174 lb), SpO2 98 %.  PHYSICAL EXAMINATION:  GENERAL:  47 y.o.-year-old patient lying in the bed with no acute distress.  EYES: Pupils equal, round, reactive to light and accommodation. No scleral icterus. Extraocular muscles intact.  HEENT: Head atraumatic, normocephalic. Oropharynx and nasopharynx clear.  NECK:  Supple, no jugular venous distention. No thyroid enlargement, no tenderness.  LUNGS: Normal breath sounds bilaterally, no wheezing, rales,rhonchi or crepitation. No use of accessory muscles of  respiration.  CARDIOVASCULAR: S1, S2 normal. No murmurs, rubs, or gallops.  ABDOMEN: Soft, nontender, nondistended. Bowel sounds present. No organomegaly or mass.  EXTREMITIES: No pedal edema, cyanosis, or clubbing.  NEUROLOGIC: Cranial nerves II through XII are intact. Muscle strength 5/5 in all extremities. Sensation intact. Gait not checked.  PSYCHIATRIC: The patient is alert and oriented x 3.  SKIN: No obvious rash, lesion, or ulcer.    LABORATORY PANEL:   CBC Recent Labs  Lab 04/17/18 0551  WBC 3.5*  HGB 13.5  HCT 39.4*  PLT 112*   ------------------------------------------------------------------------------------------------------------------  Chemistries  Recent Labs  Lab 04/16/18 0800 04/17/18 0551  NA 131* 138  K 3.0* 4.2  CL 94* 104  CO2 23 26  GLUCOSE 116* 100*  BUN 9 9  CREATININE 0.87 0.90  CALCIUM 8.5* 8.8*  MG 1.6* 2.0  AST 103*  --   ALT 53  --   ALKPHOS 89  --   BILITOT 0.6  --    ------------------------------------------------------------------------------------------------------------------  Cardiac Enzymes No results for input(s): TROPONINI in the last 168 hours. ------------------------------------------------------------------------------------------------------------------  RADIOLOGY:  No results found.  EKG:   Orders placed or performed during the hospital encounter of 04/16/18  . ED EKG  . ED EKG    ASSESSMENT AND PLAN:   Alcohol withdrawal: Patient had anxiety, tremors when he came.   Continue thiamine, folic acid/patient has no nausea or vomiting or hallucinations .  Patient CIWA scale is 1 because of his tremors otherwise he is stable 2.  Severe hypokalemia, hypomagnesemia: Replace potassium, magnesium, both are improved today.  #3 essential hypertension, still uncontrolled, having issues with severely elevated blood pressure.  Intolerance to hydralazine.  Continue clonidine, nifedipine, adddiuretics. All  the records are  reviewed and case discussed with Care Management/Social Workerr. Management plans discussed with the patient, family and they are in agreement.  CODE STATUS: Full code  TOTAL TIME TAKING CARE OF THIS PATIENT: 35 minutes.   POSSIBLE D/C IN DUE to DAYS, DEPENDING ON CLINICAL CONDITION.   Katha Hamming M.D on 04/18/2018 at 4:51 PM  Between 7am to 6pm - Pager - [phone]  After 6pm go to [email or web address] - password [email or web address] [facility]  [place] Hospitalists  Office  [phone]  CC: Primary care physician; Services, [facility]   Note: This dictation was prepared with Dragon dictation along with smaller phrase technology. Any transcriptional errors that result from this process are unintentional.

## 2018-04-18 NOTE — Progress Notes (Signed)
Likely discharge later today pending improvement of blood pressure.  Receiving the hydralazine, clonidine. Discharge instructions on the computer.  Patient is scoring only one on CIWA scale.  Advised the patient to quit  Alcohol.  Discharge medications include nifedipine XL cardiac cell 30 mg daily, hydralazine 25 mg p.o. 4 times daily.  Patient advised to have a BP machine and check BP and keep the log before he goes to PCP.

## 2018-04-19 LAB — HIV ANTIBODY (ROUTINE TESTING W REFLEX): HIV Screen 4th Generation wRfx: NONREACTIVE

## 2018-04-19 MED ORDER — ISOSORBIDE MONONITRATE ER 30 MG PO TB24: 30.0000 mg | ORAL_TABLET | Freq: Every day | ORAL | 0 refills | Status: AC

## 2018-04-19 MED ORDER — ISOSORBIDE MONONITRATE ER 30 MG PO TB24
30.0000 mg | ORAL_TABLET | Freq: Every day | ORAL | Status: DC
  Administered 2018-04-19: 30 mg via ORAL
  Filled 2018-04-19 (×2): qty 1

## 2018-04-19 NOTE — Progress Notes (Signed)
MD notified concerning continued elevated blood pressure. New orders placed per MD. Will continue to monitor.

## 2018-04-19 NOTE — Discharge Planning (Signed)
Patient IV and tele removed.  RN assessment and VS revealed stability for DC to home.  Discharge papers given, explained and educated.  Informed of suggested FU appt and appt made. Given printed scripts and 1 e-scribed to pharm.  Patient educated and suggested to purchase BP machine and take BP x2 times/day and keep daily log to show to Dr. At FU appt (patient agreed).  Once ready and ride arrives, will be wheeled to front and family transporting home via car.

## 2018-04-19 NOTE — Discharge Summary (Signed)
Evan MoccasinWilburn Ramsey, is a 47 y.o. male  DOB 01/19/1971  MRN [id number].  Admission date:  04/16/2018  Admitting Physician  Ihor AustinPavan Pyreddy, MD  Discharge Date:  04/19/2018   Primary MD  Services, [facility]  Recommendations for primary care physician for things to follow:   [facility].   Admission Diagnosis  Alcohol withdrawal syndrome with complication (HCC) [F10.239] Non-traumatic rhabdomyolysis [M62.82]   Discharge Diagnosis  Alcohol withdrawal syndrome with complication (HCC) [F10.239] Non-traumatic rhabdomyolysis [M62.82]    Active Problems:   Alcohol withdrawal (HCC)      Past Medical History:  Diagnosis Date  . Anxiety   . Hypertension     Past Surgical History:  Procedure Laterality Date  . none         History of present illness and  Hospital Course:     Kindly see H&P for history of present illness and admission details, please review complete Labs, Consult reports and Test reports for all details in brief  HPI  from the history and physical done on the day of admission 47 year old male patient history of alcohol abuse came in on June 1 because of anxiety, tremors, admitted for alcohol withdrawal symptoms and also found to have severely elevated blood pressure.   Hospital Course  Alcohol withdrawal/delirium tremens: Patient initially had tremors, some hallucination as reported in H&P but did not have any hallucinations for the last 2 days.  Patient CIWA scale is only 1 because of his tremor but denies any hallucination.  Patient did not go to delirium tremens.  He appears comfortable, hemodynamically stable.  Advised him to quit drinking.  He received thiamine, folic acid, IV multivitamins.  Also started on oral Librium. 2.  Acute hypokalemia,  hypomagnesemia: Replaced. 3.  Essential hypertension: Uncontrolled, main issue with him his BP that is not controlled and BP is staying and 167/113.  Patient received clonidine, hydralazine.  Patient got intolerant to hydralazine so we stopped it continued clonidine, started on Imdur last night, this morning blood pressure 132/70.  Patient denies any complaints, stable for discharge, patient can continue his home dose nifedipine, added clonidine, Imdur to his BP medicines.  Advised to [facility].     Discharge Condition: stable   Follow UP  Follow-up Information    Services, [facility]. Schedule an appointment as soon as possible for a visit in 1 week(s).   Contact information: [address] [place] [address] [phone]             Discharge Instructions  and  Discharge Medications      Allergies as of 04/19/2018   No Known Allergies     Medication List    TAKE these medications   aspirin 325 MG tablet Take 325 mg by mouth daily.   cloNIDine 0.2 MG tablet Commonly known as:  CATAPRES Take 1 tablet (0.2 mg total) by mouth 3 (three) times daily.   isosorbide mononitrate 30 MG 24 hr tablet Commonly known as:  IMDUR Take 1 tablet (30 mg total) by mouth daily. Start taking on:  04/20/2018   NIFEdipine 30 MG 24 hr tablet Commonly known as:  PROCARDIA-XL/ADALAT CC Take 30 mg by mouth daily.         Diet and Activity recommendation: See Discharge Instructions above   Consults obtained - none   Major procedures and Radiology Reports - PLEASE review detailed and final reports  for all details, in brief -      No results found.  Micro Results     No results found for this or any previous visit (from the past 240 hour(s)).     Today   Subjective:   Evan Ramsey today has no headache,no chest abdominal pain,no new weakness tingling or numbness, feels much better wants to go home today.    Objective:   Blood pressure 132/86, pulse 100, temperature 98.5 F (36.9 C), temperature source Oral, resp. rate 16, height 5\' 10"  (1.778 m), weight 78.9 kg (174 lb), SpO2 98 %.   Intake/Output Summary (Last 24 hours) at 04/19/2018 1033 Last data filed at 04/18/2018 1300 Gross per 24 hour  Intake 480 ml  Output -  Net 480 ml    Exam Awake Alert, Oriented x 3, No new F.N deficits, Normal affect [place].AT,PERRAL Supple Neck,No JVD, No cervical lymphadenopathy appriciated.  Symmetrical Chest wall movement, Good air movement bilaterally, CTAB RRR,No Gallops,Rubs or new Murmurs, No Parasternal Heave +ve B.Sounds, Abd Soft, Non tender, No organomegaly appriciated, No rebound -guarding or rigidity. No Cyanosis, Clubbing or edema, No new Rash or bruise  Data Review   CBC w Diff:  Lab Results  Component Value Date   WBC 3.5 (L) 04/17/2018   HGB 13.5 04/17/2018   HCT 39.4 (L) 04/17/2018   PLT 112 (L) 04/17/2018    CMP:  Lab Results  Component Value Date   NA 138 04/17/2018   K 4.2 04/17/2018   CL 104 04/17/2018   CO2 26 04/17/2018   BUN 9 04/17/2018   CREATININE 0.90 04/17/2018   PROT 8.6 (H) 04/16/2018   ALBUMIN 4.5 04/16/2018   BILITOT 0.6 04/16/2018   ALKPHOS 89 04/16/2018   AST 103 (H) 04/16/2018   ALT 53 04/16/2018  .   Total Time in preparing paper work, data evaluation and todays exam - 35 minutes  Katha Hamming M.D on 04/19/2018 at 10:33 AM    Note: This dictation was prepared with Dragon dictation along with smaller phrase technology. Any transcriptional errors that result from this process are unintentional.

## 2018-06-08 ENCOUNTER — Emergency Department: Payer: Self-pay

## 2018-06-08 ENCOUNTER — Emergency Department
Admission: EM | Admit: 2018-06-08 | Discharge: 2018-06-08 | Disposition: A | Discharge status: Home / Self Care | Payer: Self-pay | Attending: Emergency Medicine | Admitting: Emergency Medicine

## 2018-06-08 ENCOUNTER — Encounter: Payer: Self-pay | Admitting: Emergency Medicine

## 2018-06-08 ENCOUNTER — Other Ambulatory Visit: Payer: Self-pay

## 2018-06-08 DIAGNOSIS — F10920 Alcohol use, unspecified with intoxication, uncomplicated: Secondary | ICD-10-CM

## 2018-06-08 DIAGNOSIS — R0789 Other chest pain: Secondary | ICD-10-CM | POA: Insufficient documentation

## 2018-06-08 DIAGNOSIS — Z79899 Other long term (current) drug therapy: Secondary | ICD-10-CM | POA: Insufficient documentation

## 2018-06-08 DIAGNOSIS — F10929 Alcohol use, unspecified with intoxication, unspecified: Secondary | ICD-10-CM | POA: Insufficient documentation

## 2018-06-08 DIAGNOSIS — K219 Gastro-esophageal reflux disease without esophagitis: Secondary | ICD-10-CM | POA: Insufficient documentation

## 2018-06-08 DIAGNOSIS — I10 Essential (primary) hypertension: Secondary | ICD-10-CM | POA: Insufficient documentation

## 2018-06-08 DIAGNOSIS — F17210 Nicotine dependence, cigarettes, uncomplicated: Secondary | ICD-10-CM | POA: Insufficient documentation

## 2018-06-08 DIAGNOSIS — R079 Chest pain, unspecified: Secondary | ICD-10-CM

## 2018-06-08 DIAGNOSIS — F17290 Nicotine dependence, other tobacco product, uncomplicated: Secondary | ICD-10-CM | POA: Insufficient documentation

## 2018-06-08 DIAGNOSIS — R066 Hiccough: Secondary | ICD-10-CM | POA: Insufficient documentation

## 2018-06-08 DIAGNOSIS — Z7982 Long term (current) use of aspirin: Secondary | ICD-10-CM | POA: Insufficient documentation

## 2018-06-08 LAB — CBC
HCT: 45.5 % (ref 40.0–52.0)
HEMOGLOBIN: 15.7 g/dL (ref 13.0–18.0)
MCH: 30.4 pg (ref 26.0–34.0)
MCHC: 34.6 g/dL (ref 32.0–36.0)
MCV: 88 fL (ref 80.0–100.0)
Platelets: 146 10*3/uL — ABNORMAL LOW (ref 150–440)
RBC: 5.17 MIL/uL (ref 4.40–5.90)
RDW: 14.8 % — ABNORMAL HIGH (ref 11.5–14.5)
WBC: 8 10*3/uL (ref 3.8–10.6)

## 2018-06-08 LAB — COMPREHENSIVE METABOLIC PANEL
ALBUMIN: 4.3 g/dL (ref 3.5–5.0)
ALK PHOS: 90 U/L (ref 38–126)
ALT: 98 U/L — ABNORMAL HIGH (ref 0–44)
ANION GAP: 12 (ref 5–15)
AST: 171 U/L — AB (ref 15–41)
BUN: 9 mg/dL (ref 6–20)
CALCIUM: 8.5 mg/dL — AB (ref 8.9–10.3)
CO2: 29 mmol/L (ref 22–32)
Chloride: 92 mmol/L — ABNORMAL LOW (ref 98–111)
Creatinine, Ser: 0.93 mg/dL (ref 0.61–1.24)
GFR calc Af Amer: >60 mL/min (ref >60)
GFR calc non Af Amer: >60 mL/min (ref >60)
GLUCOSE: 126 mg/dL — AB (ref 70–99)
Potassium: 3.6 mmol/L (ref 3.5–5.1)
SODIUM: 133 mmol/L — AB (ref 135–145)
Total Bilirubin: 0.6 mg/dL (ref 0.3–1.2)
Total Protein: 8.6 g/dL — ABNORMAL HIGH (ref 6.5–8.1)

## 2018-06-08 LAB — ETHANOL: Alcohol, Ethyl (B): 291 mg/dL — ABNORMAL HIGH (ref <10)

## 2018-06-08 LAB — TROPONIN I: Troponin I: <0.03 ng/mL (ref <0.03)

## 2018-06-08 MED ORDER — LORAZEPAM 2 MG/ML IJ SOLN
1.0000 mg | Freq: Once | INTRAMUSCULAR | Status: AC
  Administered 2018-06-08: 1 mg via INTRAVENOUS
  Filled 2018-06-08: qty 1

## 2018-06-08 MED ORDER — GI COCKTAIL ~~LOC~~
30.0000 mL | Freq: Once | ORAL | Status: AC
  Administered 2018-06-08: 30 mL via ORAL
  Filled 2018-06-08: qty 30

## 2018-06-08 MED ORDER — SUCRALFATE 1 G PO TABS: 1.0000 g | ORAL_TABLET | Freq: Four times a day (QID) | ORAL | 0 refills | Status: AC

## 2018-06-08 MED ORDER — FAMOTIDINE 40 MG PO TABS: 40.0000 mg | ORAL_TABLET | Freq: Every evening | ORAL | 1 refills | Status: DC

## 2018-06-08 NOTE — ED Notes (Signed)
Report given to [facility] Center[facility]aj[facility], Charity fundraiserN.

## 2018-06-08 NOTE — Discharge Instructions (Signed)
Please follow-up with your doctor.  Return to the emergency department for any return of/worsening chest pain trouble breathing, or any other symptom personally concerning to yourself

## 2018-06-08 NOTE — ED Provider Notes (Signed)
[facility] Emergency Department Provider Note  ____________________________________________   I have reviewed the triage vital signs and the nursing notes.   HISTORY  Chief Complaint Hiccups   History limited by: Not Limited   HPI Evan Ramsey is a 47 y.o. male who presents to the emergency department today because of hiccups.  He states the hiccups started last night.  When asked if he ever had hiccups in the past he stated never like this.  It is slightly unclear what he meant by that.  Family states that he has been drinking a lot recently.  Patient has been prescribed medication for heartburn but never got it filled.  Had been seen earlier today in the emergency department for chest pain had negative work-up at that time including 2 troponins.   Per medical record review patient has a history of anxiety and htn. Had hiccups during ER visit this morning.   Past Medical History:  Diagnosis Date  . Anxiety   . Hypertension     Patient Active Problem List   Diagnosis Date Noted  . 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 none      Prior to Admission medications   Medication Sig Start Date End Date Taking? Authorizing Provider  aspirin 325 MG tablet Take 325 mg by mouth daily.    [provider]  cloNIDine (CATAPRES) 0.2 MG tablet Take 1 tablet (0.2 mg total) by mouth 3 (three) times daily. 04/18/18   Katha HammingKonidena, Snehalatha, MD  isosorbide mononitrate (IMDUR) 30 MG 24 hr tablet Take 1 tablet (30 mg total) by mouth daily. 04/20/18   Katha HammingKonidena, Snehalatha, MD  NIFEdipine (PROCARDIA-XL/ADALAT CC) 30 MG 24 hr tablet Take 30 mg by mouth daily.    [provider]    Allergies Patient has no known allergies.  Family History  Problem Relation Age of Onset  . Hypertension Mother   . Anxiety disorder Mother     Social History Social History   Tobacco Use  . Smoking status: Current  Every Day Smoker    Types: Cigars  . Smokeless tobacco: Never Used  Substance Use Topics  . Alcohol use: Yes    Alcohol/week: 50.4 oz    Types: 84 Cans of beer per week  . Drug use: Never    Review of Systems Constitutional: No fever/chills Eyes: No visual changes. ENT: No sore throat. Cardiovascular: Positive chest pain. Respiratory: Denies shortness of breath. Gastrointestinal: No abdominal pain.  No nausea, no vomiting.  No diarrhea.   Genitourinary: Negative for dysuria. Musculoskeletal: Negative for back pain. Skin: Negative for rash. Neurological: Negative for headaches, focal weakness or numbness.  ____________________________________________   PHYSICAL EXAM:  VITAL SIGNS: ED Triage Vitals  Enc Vitals Group     BP 06/08/18 1555 (!) 151/107     Pulse Rate 06/08/18 1555 (!) 113     Resp 06/08/18 1555 18     Temp 06/08/18 1555 98.4 F (36.9 C)     Temp Source 06/08/18 1555 Oral     SpO2 06/08/18 1555 99 %     Weight 06/08/18 1556 175 lb (79.4 kg)     Height 06/08/18 1556 5\' 11"  (1.803 m)     Head Circumference --      Peak Flow --      Pain Score 06/08/18 1556 6     Pain Loc --      Pain Edu? --  Excl. in GC? --      Constitutional: Alert and oriented. Frequent hiccups.  Eyes: Conjunctivae are normal.  ENT      Head: Normocephalic and atraumatic.      Nose: No congestion/rhinnorhea.      Mouth/Throat: Mucous membranes are moist.      Neck: No stridor. Hematological/Lymphatic/Immunilogical: No cervical lymphadenopathy. Cardiovascular: Normal rate, regular rhythm.  No murmurs, rubs, or gallops.  Respiratory: Normal respiratory effort without tachypnea nor retractions. Breath sounds are clear and equal bilaterally. No wheezes/rales/rhonchi. Gastrointestinal: Soft and non tender. No rebound. No guarding.  Genitourinary: Deferred Musculoskeletal: Normal range of motion in all extremities. No lower extremity edema. Neurologic:  Normal speech and  language. No gross focal neurologic deficits are appreciated.  Skin:  Skin is warm, dry and intact. No rash noted. Psychiatric: Mood and affect are normal. Speech and behavior are normal. Patient exhibits appropriate insight and judgment.  ____________________________________________    LABS (pertinent positives/negatives)  None  ____________________________________________   EKG  None  ____________________________________________    RADIOLOGY  None  ____________________________________________   PROCEDURES  Procedures  ____________________________________________   INITIAL IMPRESSION / ASSESSMENT AND PLAN / ED COURSE  Pertinent labs & imaging results that were available during my care of the patient were reviewed by me and considered in my medical decision making (see chart for details).   Patient presented to the emergency department today because of concerns for hiccups.  Patient was seen earlier in the day for chest pain and hiccups.  Patient did feel relieved after GI cocktail.  It sounds like he has been drinking a lot recently.  They wonder if acid reflux was the cause.  Discussed this with patient and family.  Will discharge with Pepcid and sucralfate.    ____________________________________________   FINAL CLINICAL IMPRESSION(S) / ED DIAGNOSES  Final diagnoses:  Hiccups  Gastroesophageal reflux disease, esophagitis presence not specified     Note: This dictation was prepared with Dragon dictation. Any transcriptional errors that result from this process are unintentional     Phineas Semen, MD 06/08/18 [phone]

## 2018-06-08 NOTE — ED Triage Notes (Signed)
Pt experienced diffuse dull chest pain this morning. Upon EMS arrival chest pain was 0/10. Pt has hx of anxiety. Woke up with rapid breathing RR 24 with EMS. 12 pack of bud light last night per mother on scene who was concerned for withdrawal.

## 2018-06-08 NOTE — ED Triage Notes (Signed)
Patient presents to the ED with hiccups x 2 hours.  Patient was evaluated in the ED yesterday and this morning for chest pain.  Denies chest pain at this time.  Patient reports after the hiccups he began to have a headache.  Patient denies abdominal pain and shortness of breath at this time.  Skin is warm and dry.  No obvious distress.

## 2018-06-08 NOTE — ED Provider Notes (Signed)
[facility] Provider Note  Time seen: 7:11 AM  I have reviewed the triage vital signs and the nursing notes.   HISTORY  Chief Complaint Chest Pain and Anxiety    HPI Evan Ramsey is a 47 y.o. male with a past medical history of anxiety, hypertension, presents to the emergency department for chest pain.  According to the patient he was drinking alcohol last night approximately 12 pack of beer stopped drinking around 3:00 this morning.  States he woke at 5:00 this morning with dull pressure to the center of his chest also with hiccups.  Patient called 911, took 325 mg aspirin at home.  EMS states upon their arrival the patient states that chest pain had diminished, gave him a spray of nitroglycerin and upon arrival to the emergency department the patient denies any chest pain.  States he was feeling a little short of breath this morning around 5:00, denies any diaphoresis or nausea.  No vomiting.  No leg pain or swelling.  Largely negative review of systems.   Past Medical History:  Diagnosis Date  . Anxiety   . Hypertension     Patient Active Problem List   Diagnosis Date Noted  . 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 none      Prior to Admission medications   Medication Sig Start Date End Date Taking? Authorizing Provider  aspirin 325 MG tablet Take 325 mg by mouth daily.    [provider]  cloNIDine (CATAPRES) 0.2 MG tablet Take 1 tablet (0.2 mg total) by mouth 3 (three) times daily. 04/18/18   Katha HammingKonidena, Snehalatha, MD  isosorbide mononitrate (IMDUR) 30 MG 24 hr tablet Take 1 tablet (30 mg total) by mouth daily. 04/20/18   Katha HammingKonidena, Snehalatha, MD  NIFEdipine (PROCARDIA-XL/ADALAT CC) 30 MG 24 hr tablet Take 30 mg by mouth daily.    [provider]    No Known Allergies  Family History  Problem Relation Age of Onset  . Hypertension Mother   . Anxiety  disorder Mother     Social History Social History   Tobacco Use  . Smoking status: Current Every Day Smoker    Types: Cigars  . Smokeless tobacco: Never Used  Substance Use Topics  . Alcohol use: Yes  . Drug use: Not on file    Review of Systems Constitutional: Negative for fever Cardiovascular: Mild dull chest pain, now resolved Respiratory: Mild shortness of breath Gastrointestinal: Negative for abdominal pain, vomiting  Genitourinary: Negative for urinary compaints Musculoskeletal: Negative for leg pain or swelling Skin: Negative for skin complaints  Neurological: Negative for headache All other ROS negative  ____________________________________________   PHYSICAL EXAM:  VITAL SIGNS: ED Triage Vitals  Enc Vitals Group     BP 06/08/18 0707 (!) 137/95     Pulse Rate 06/08/18 0707 (!) 113     Resp 06/08/18 0707 20     Temp 06/08/18 0707 98.4 F (36.9 C)     Temp Source 06/08/18 0707 Oral     SpO2 06/08/18 0707 94 %     Weight 06/08/18 0710 175 lb (79.4 kg)     Height 06/08/18 0710 5\' 11"  (1.803 m)     Head Circumference --      Peak Flow --      Pain Score 06/08/18 0709 5     Pain Loc --      Pain Edu? --  Excl. in GC? --     Constitutional: Alert, somnolent but otherwise well-appearing.  Is able to answer questions and follow commands. Eyes: Normal exam ENT   Head: Normocephalic and atraumatic.   Mouth/Throat: Mucous membranes are moist. Cardiovascular: Normal rate, regular rhythm. No murmur Respiratory: Normal respiratory effort without tachypnea nor retractions. Breath sounds are clear  Gastrointestinal: Soft and nontender. No distention.   Musculoskeletal: Nontender with normal range of motion in all extremities. Neurologic:  Normal speech and language. No gross focal neurologic deficits Skin:  Skin is warm, dry and intact.  Psychiatric: Mood and affect are normal.   ____________________________________________    EKG  EKG reviewed  and interpreted by myself shows sinus tachycardia at 115 bpm narrow QRS, normal axis, normal intervals, nonspecific but no concerning ST changes.  ____________________________________________    RADIOLOGY  Atelectasis without consolidation  ____________________________________________   INITIAL IMPRESSION / ASSESSMENT AND PLAN / ED COURSE  Pertinent labs & imaging results that were available during my care of the patient were reviewed by me and considered in my medical decision making (see chart for details).  Patient presents to the emergency department for chest pain.  Patient states he was up drinking alcohol last night stopped drinking around 3:00 this morning and developed chest pain around 5:00 this morning.  States the chest pain has completely resolved at this time but he continues to have hiccups.  Differential would include ACS, gastric reflux, pancreatitis, gastritis, esophageal spasm/esophagitis.  We will check labs, chest x-ray and EKG.  Overall the patient appears well currently denies any symptoms besides hiccups at this time.  Repeat troponin is negative.  Chest pain has dissipated.  I discussed with the patient alcohol detox.  He states he does not want to go to a detox center.  Patient is ready to go home.  We will discharge from the emergency department.  I provided my normal chest pain return precautions.  We will also provide RTS follow-up if the patient changes his mind.  ____________________________________________   FINAL CLINICAL IMPRESSION(S) / ED DIAGNOSES  Chest pain Alcohol intoxication   Minna Antis, MD 06/08/18 1209

## 2018-06-08 NOTE — Discharge Instructions (Addendum)
Please seek medical attention for any high fevers, chest pain, shortness of breath, change in behavior, persistent vomiting, bloody stool or any other new or concerning symptoms.  

## 2018-06-08 NOTE — ED Notes (Signed)
Signature pad in room not working. Hard copy of discharge signature placed in file.

## 2018-11-18 ENCOUNTER — Encounter: Payer: Self-pay | Admitting: Intensive Care

## 2018-11-18 ENCOUNTER — Other Ambulatory Visit: Payer: Self-pay

## 2018-11-18 ENCOUNTER — Inpatient Hospital Stay
Admission: EM | Admit: 2018-11-18 | Discharge: 2018-11-21 | DRG: 439 | Disposition: A | Discharge status: Home / Self Care | Payer: Self-pay | Attending: Internal Medicine | Admitting: Internal Medicine

## 2018-11-18 ENCOUNTER — Emergency Department: Payer: Self-pay

## 2018-11-18 DIAGNOSIS — Z7982 Long term (current) use of aspirin: Secondary | ICD-10-CM

## 2018-11-18 DIAGNOSIS — E876 Hypokalemia: Secondary | ICD-10-CM | POA: Diagnosis present

## 2018-11-18 DIAGNOSIS — F17290 Nicotine dependence, other tobacco product, uncomplicated: Secondary | ICD-10-CM | POA: Diagnosis present

## 2018-11-18 DIAGNOSIS — E8809 Other disorders of plasma-protein metabolism, not elsewhere classified: Secondary | ICD-10-CM | POA: Diagnosis present

## 2018-11-18 DIAGNOSIS — K8521 Alcohol induced acute pancreatitis with uninfected necrosis: Principal | ICD-10-CM | POA: Diagnosis present

## 2018-11-18 DIAGNOSIS — E871 Hypo-osmolality and hyponatremia: Secondary | ICD-10-CM | POA: Diagnosis present

## 2018-11-18 DIAGNOSIS — K859 Acute pancreatitis without necrosis or infection, unspecified: Secondary | ICD-10-CM | POA: Diagnosis present

## 2018-11-18 DIAGNOSIS — K852 Alcohol induced acute pancreatitis without necrosis or infection: Secondary | ICD-10-CM

## 2018-11-18 DIAGNOSIS — I10 Essential (primary) hypertension: Secondary | ICD-10-CM | POA: Diagnosis present

## 2018-11-18 DIAGNOSIS — K8689 Other specified diseases of pancreas: Secondary | ICD-10-CM

## 2018-11-18 DIAGNOSIS — R000 Tachycardia, unspecified: Secondary | ICD-10-CM | POA: Diagnosis present

## 2018-11-18 DIAGNOSIS — Z23 Encounter for immunization: Secondary | ICD-10-CM

## 2018-11-18 DIAGNOSIS — Z79899 Other long term (current) drug therapy: Secondary | ICD-10-CM

## 2018-11-18 LAB — COMPREHENSIVE METABOLIC PANEL
ALBUMIN: 4.3 g/dL (ref 3.5–5.0)
ALK PHOS: 72 U/L (ref 38–126)
ALT: 38 U/L (ref 0–44)
ANION GAP: 13 (ref 5–15)
AST: 44 U/L — ABNORMAL HIGH (ref 15–41)
BUN: 16 mg/dL (ref 6–20)
CALCIUM: 8.7 mg/dL — AB (ref 8.9–10.3)
CHLORIDE: 95 mmol/L — AB (ref 98–111)
CO2: 25 mmol/L (ref 22–32)
Creatinine, Ser: 1.01 mg/dL (ref 0.61–1.24)
GFR calc Af Amer: >60 mL/min (ref >60)
GFR calc non Af Amer: >60 mL/min (ref >60)
GLUCOSE: 116 mg/dL — AB (ref 70–99)
Potassium: 4 mmol/L (ref 3.5–5.1)
SODIUM: 133 mmol/L — AB (ref 135–145)
Total Bilirubin: 0.6 mg/dL (ref 0.3–1.2)
Total Protein: 8 g/dL (ref 6.5–8.1)

## 2018-11-18 LAB — URINALYSIS, COMPLETE (UACMP) WITH MICROSCOPIC
BILIRUBIN URINE: NEGATIVE
Bacteria, UA: NONE SEEN
GLUCOSE, UA: NEGATIVE mg/dL
HGB URINE DIPSTICK: NEGATIVE
Ketones, ur: NEGATIVE mg/dL
Leukocytes, UA: NEGATIVE
Nitrite: NEGATIVE
Protein, ur: 30 mg/dL — AB
Specific Gravity, Urine: 1.021 (ref 1.005–1.030)
pH: 5 (ref 5.0–8.0)

## 2018-11-18 LAB — CBC
HCT: 46.1 % (ref 39.0–52.0)
Hemoglobin: 15.7 g/dL (ref 13.0–17.0)
MCH: 30.3 pg (ref 26.0–34.0)
MCHC: 34.1 g/dL (ref 30.0–36.0)
MCV: 89 fL (ref 80.0–100.0)
Platelets: 186 10*3/uL (ref 150–400)
RBC: 5.18 MIL/uL (ref 4.22–5.81)
RDW: 13.2 % (ref 11.5–15.5)
WBC: 11.2 10*3/uL — ABNORMAL HIGH (ref 4.0–10.5)
nRBC: 0 % (ref 0.0–0.2)

## 2018-11-18 LAB — ETHANOL: Alcohol, Ethyl (B): 108 mg/dL — ABNORMAL HIGH (ref <10)

## 2018-11-18 LAB — LIPASE, BLOOD: LIPASE: 77 U/L — AB (ref 11–51)

## 2018-11-18 MED ORDER — ONDANSETRON 4 MG PO TBDP
4.0000 mg | ORAL_TABLET | Freq: Once | ORAL | Status: AC | PRN
  Administered 2018-11-18: 4 mg via ORAL
  Filled 2018-11-18: qty 1

## 2018-11-18 MED ORDER — HYDROMORPHONE HCL 1 MG/ML IJ SOLN
0.5000 mg | Freq: Once | INTRAMUSCULAR | Status: AC
  Administered 2018-11-18: 0.5 mg via INTRAVENOUS
  Filled 2018-11-18: qty 1

## 2018-11-18 MED ORDER — LORAZEPAM 1 MG PO TABS: 1.0000 mg | ORAL_TABLET | Freq: Four times a day (QID) | ORAL | Status: DC | PRN

## 2018-11-18 MED ORDER — OXYCODONE-ACETAMINOPHEN 5-325 MG PO TABS
1.0000 | ORAL_TABLET | Freq: Once | ORAL | Status: AC
  Administered 2018-11-18: 1 via ORAL

## 2018-11-18 MED ORDER — LORAZEPAM 2 MG/ML IJ SOLN
1.0000 mg | Freq: Four times a day (QID) | INTRAMUSCULAR | Status: DC | PRN
  Administered 2018-11-18: 1 mg via INTRAVENOUS
  Filled 2018-11-18: qty 1

## 2018-11-18 MED ORDER — LORAZEPAM 2 MG/ML IJ SOLN
1.0000 mg | Freq: Once | INTRAMUSCULAR | Status: AC
  Administered 2018-11-18: 1 mg via INTRAVENOUS
  Filled 2018-11-18: qty 1

## 2018-11-18 MED ORDER — LORAZEPAM 2 MG/ML IJ SOLN
INTRAMUSCULAR | Status: AC
  Filled 2018-11-18: qty 1

## 2018-11-18 MED ORDER — IOPAMIDOL (ISOVUE-300) INJECTION 61%
30.0000 mL | Freq: Once | INTRAVENOUS | Status: AC
  Administered 2018-11-18: 30 mL via ORAL
  Filled 2018-11-18: qty 30

## 2018-11-18 MED ORDER — IOPAMIDOL (ISOVUE-300) INJECTION 61%
100.0000 mL | Freq: Once | INTRAVENOUS | Status: AC | PRN
  Administered 2018-11-18: 100 mL via INTRAVENOUS
  Filled 2018-11-18: qty 100

## 2018-11-18 MED ORDER — HYDRALAZINE HCL 20 MG/ML IJ SOLN
10.0000 mg | INTRAMUSCULAR | Status: DC | PRN
  Administered 2018-11-19 (×2): 10 mg via INTRAVENOUS
  Filled 2018-11-18 (×2): qty 1

## 2018-11-18 MED ORDER — OXYCODONE-ACETAMINOPHEN 5-325 MG PO TABS
ORAL_TABLET | ORAL | Status: AC
  Filled 2018-11-18: qty 1

## 2018-11-18 MED ORDER — SODIUM CHLORIDE 0.9 % IV BOLUS
1000.0000 mL | Freq: Once | INTRAVENOUS | Status: AC
  Administered 2018-11-18: 1000 mL via INTRAVENOUS

## 2018-11-18 MED ORDER — LORAZEPAM 2 MG/ML IJ SOLN
1.0000 mg | Freq: Once | INTRAMUSCULAR | Status: AC
  Administered 2018-11-18: 1 mg via INTRAVENOUS

## 2018-11-18 NOTE — ED Triage Notes (Signed)
Patient c/o sharp abdominal pain since awakening this AM. Denies fevers. Tried aspirin and pepto with no relief at home. Denies urinary symptoms

## 2018-11-18 NOTE — ED Notes (Signed)
Patient tolerated IV meds without difficulty. Patient admits to emesis today x 1-2

## 2018-11-18 NOTE — ED Notes (Signed)
Pt lying quietly in bed, fluids infusing, CARE channel on

## 2018-11-18 NOTE — ED Provider Notes (Addendum)
[facility] Emergency Department Provider Note  ____________________________________________   I have reviewed the triage vital signs and the nursing notes. Where available I have reviewed prior notes and, if possible and indicated, outside hospital notes.    HISTORY  Chief Complaint Abdominal Pain    HPI Evan Ramsey is a 48 y.o. male who is a daily drinker, has not had alcohol since last night because he has been having epigastric abdominal pain which radiates to the left and right side.  No chest pain or shortness of breath.  Pain is persistent during the day.  Nausea.  No fevers.  No diarrhea.  States that the pain is significant.  A tried Pepto-Bismol and aspirin without relief.  Waxes and wanes but is been there on interruptedly since this morning.  Nothing makes it better or worse that he can think of.  No diarrhea.  He states he has not had something exactly like this but he does have a history of Sniffing and heartburn, he has been having antiacid's but not taking them.  He does have a history of alcohol withdrawal.  He denies any seizure or tremulousness today however.  Also has a history of anxiety.  No other alleviating or aggravating factors no other prior treatment no other history available from patient  Past Medical History:  Diagnosis Date  . Anxiety   . Hypertension     Patient Active Problem List   Diagnosis Date Noted  . 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 none      Prior to Admission medications   Medication Sig Start Date End Date Taking? Authorizing Provider  aspirin 325 MG tablet Take 325 mg by mouth daily.    [provider]  cloNIDine (CATAPRES) 0.2 MG tablet Take 1 tablet (0.2 mg total) by mouth 3 (three) times daily. 04/18/18   Katha Hamming, MD  famotidine (PEPCID) 40 MG tablet Take 1 tablet (40 mg total) by mouth every evening. 06/08/18 06/08/19   Phineas Semen, MD  isosorbide mononitrate (IMDUR) 30 MG 24 hr tablet Take 1 tablet (30 mg total) by mouth daily. 04/20/18   Katha Hamming, MD  NIFEdipine (PROCARDIA-XL/ADALAT CC) 30 MG 24 hr tablet Take 30 mg by mouth daily.    [provider]  sucralfate (CARAFATE) 1 g tablet Take 1 tablet (1 g total) by mouth 4 (four) times daily. 06/08/18   Phineas Semen, MD    Allergies Patient has no known allergies.  Family History  Problem Relation Age of Onset  . Hypertension Mother   . Anxiety disorder Mother     Social History Social History   Tobacco Use  . Smoking status: Current Every Day Smoker    Types: Cigars  . Smokeless tobacco: Never Used  Substance Use Topics  . Alcohol use: Yes    Alcohol/week: 84.0 standard drinks    Types: 84 Cans of beer per week  . Drug use: Never    Review of Systems Constitutional: No fever/chills Eyes: No visual changes. ENT: No sore throat. No stiff neck no neck pain Cardiovascular: Denies chest pain. Respiratory: Denies shortness of breath. Gastrointestinal: HPI Genitourinary: Negative for dysuria. Musculoskeletal: Negative lower extremity swelling Skin: Negative for rash. Neurological: Negative for severe headaches, focal weakness or numbness.   ____________________________________________   PHYSICAL EXAM:  VITAL SIGNS: ED Triage Vitals  Enc Vitals Group     BP 11/18/18 1630 (!) 167/98     Pulse  Rate 11/18/18 1630 100     Resp --      Temp 11/18/18 1630 98.4 F (36.9 C)     Temp Source 11/18/18 1630 Oral     SpO2 11/18/18 1630 98 %     Weight 11/18/18 1631 175 lb (79.4 kg)     Height 11/18/18 1631 5\' 11"  (1.803 m)     Head Circumference --      Peak Flow --      Pain Score 11/18/18 1635 10     Pain Loc --      Pain Edu? --      Excl. in GC? --     Constitutional: Alert and oriented. Well appearing and in no acute distress. Eyes: Conjunctivae are normal Head: Atraumatic HEENT: No  congestion/rhinnorhea. Mucous membranes are moist.  Oropharynx non-erythematous Neck:   Nontender with no meningismus, no masses, no stridor Cardiovascular: Normal rate, regular rhythm. Grossly normal heart sounds.  Good peripheral circulation. Respiratory: Normal respiratory effort.  No retractions. Lungs CTAB. Abdominal: Soft and notes palpation the epigastric region anterior guarding no rebound soft Back:  There is no focal tenderness or step off.  there is no midline tenderness there are no lesions noted. there is no CVA tenderness Musculoskeletal: No lower extremity tenderness, no upper extremity tenderness. No joint effusions, no DVT signs strong distal pulses no edema Neurologic:  Normal speech and language. No gross focal neurologic deficits are appreciated.  Skin:  Skin is warm, dry and intact. No rash noted. Psychiatric: Mood and affect are normal. Speech and behavior are normal.  ____________________________________________   LABS (all labs ordered are listed, but only abnormal results are displayed)  Labs Reviewed  LIPASE, BLOOD - Abnormal; Notable for the following components:      Result Value   Lipase 77 (*)    All other components within normal limits  COMPREHENSIVE METABOLIC PANEL - Abnormal; Notable for the following components:   Sodium 133 (*)    Chloride 95 (*)    Glucose, Bld 116 (*)    Calcium 8.7 (*)    AST 44 (*)    All other components within normal limits  CBC - Abnormal; Notable for the following components:   WBC 11.2 (*)    All other components within normal limits  URINALYSIS, COMPLETE (UACMP) WITH MICROSCOPIC - Abnormal; Notable for the following components:   Color, Urine YELLOW (*)    APPearance CLEAR (*)    Protein, ur 30 (*)    All other components within normal limits  ETHANOL - Abnormal; Notable for the following components:   Alcohol, Ethyl (B) 108 (*)    All other components within normal limits    Pertinent labs  results that were  available during my care of the patient were reviewed by me and considered in my medical decision making (see chart for details). ____________________________________________  EKG  I personally interpreted any EKGs ordered by me or triage Tach rate 115, no acute ST elevation or depression normal axis ____________________________________________  RADIOLOGY  Pertinent labs & imaging results that were available during my care of the patient were reviewed by me and considered in my medical decision making (see chart for details). If possible, patient and/or family made aware of any abnormal findings.  No results found. ____________________________________________    PROCEDURES  Procedure(s) performed: None  Procedures  Critical Care performed: None  ____________________________________________   INITIAL IMPRESSION / ASSESSMENT AND PLAN / ED COURSE  Pertinent labs & imaging results  that were available during my care of the patient were reviewed by me and considered in my medical decision making (see chart for details).  With a history of significant alcohol abuse presents today complaining of epigastric abdominal pain which is quite reproducible I do not think this represents ACS PE or dissection, differential includes pancreatitis he does have a mildly elevated lipase as well as diverticulitis, gallbladder disease etc.  Blood work however is reassuring exception of a mildly elevated lipase.  He is not markedly acidemic.  We are giving him IV fluids I am giving him pain medication I will give him Ativan to forestall withdrawal, alcoholic gastritis is certainly also on the differential perforated viscus is on the differential given all this and his history of EtOH abuse we will obtain CT scan although he states he has not had any alcohol since yesterday his alcohol today was 108.  We will see what we see on CT scan and continue to watch him closely  here.  ----------------------------------------- 9:10 PM on 11/18/2018 -----------------------------------------  Patient with acute pancreatitis, even though his lipase is not significantly elevated, his pancreas shows evidence of peri-pancreatic fluid and stranding.  I think that if we send him home even if we get his pain under control, he will likely return in withdrawal.  His heart rate is somewhat up here, but clinically does not appear to be in withdrawal there are multiple reasons for this including anxiety about his diagnosis anxiety in general, pain, etc.  We have given another dose of Ativan to be on the safe side he is calm at this time and says his pain is better but not gone.  I am admitting him to the hospitalist service, signed out to Dr. Derrill KayGoodman until the hospitalist could see him which should be in the next few minutes.    ____________________________________________   FINAL CLINICAL IMPRESSION(S) / ED DIAGNOSES  Final diagnoses:  None      This chart was dictated using voice recognition software.  Despite best efforts to proofread,  errors can occur which can change meaning.      Jeanmarie PlantMcShane, Kalaysia Demonbreun A, MD 11/18/18 2019    Jeanmarie PlantMcShane, Manroop Jakubowicz A, MD 11/18/18 2112

## 2018-11-18 NOTE — ED Notes (Signed)
Pt to CT at this time.

## 2018-11-18 NOTE — Progress Notes (Signed)
Family Meeting Note  Advance Directive:yes  Today a meeting took place with the Patient.  Patient is able to participate   The following clinical team members were present during this meeting:MD  The following were discussed:Patient's diagnosis: Pancreatitis, Patient's progosis: Unable to determine and Goals for treatment: Full Code  Additional follow-up to be provided: prn  Time spent during discussion:20 minutes  Saeed Toren D Jeydi Klingel, MD  

## 2018-11-18 NOTE — H&P (Signed)
[facility] [place] [facility]   PATIENT NAME: Evan Ramsey    MR#:  [id number]  DATE OF BIRTH:  02-10-1971  DATE OF ADMISSION:  11/18/2018  PRIMARY CARE PHYSICIAN: Services, [facility]   REQUESTING/REFERRING PHYSICIAN:   CHIEF COMPLAINT:   Chief Complaint  Patient presents with  . Abdominal Pain    HISTORY OF PRESENT ILLNESS: Evan Ramsey  is a 48 y.o. male with a known history per below which includes alcoholism, presenting to the emergency room with acute mid/left thigh/back pain, 8-10/10, associated with nausea, emesis, no better with Pepto-Bismol or aspirin, pain comes and goes, in the emergency room patient was found to have acute pancreatitis with questionable area of necrosis/hepatic steatosis, patient noted to be tachycardic, hypertensive, sodium 133, chloride 95, lipase 77, patient is now been admitted for acute acute abdominal pain most likely secondary to acute alcoholic pancreatitis.  PAST MEDICAL HISTORY:   Past Medical History:  Diagnosis Date  . Anxiety   . Hypertension     PAST SURGICAL HISTORY:  Past Surgical History:  Procedure Laterality Date  . none      SOCIAL HISTORY:  Social History   Tobacco Use  . Smoking status: Current Every Day Smoker    Types: Cigars  . Smokeless tobacco: Never Used  Substance Use Topics  . Alcohol use: Yes    Alcohol/week: 84.0 standard drinks    Types: 84 Cans of beer per week    FAMILY HISTORY:  Family History  Problem Relation Age of Onset  . Hypertension Mother   . Anxiety disorder Mother     DRUG ALLERGIES: No Known Allergies  REVIEW OF SYSTEMS:   CONSTITUTIONAL: No fever, fatigue or weakness.  EYES: No blurred or double vision.  EARS, NOSE, AND THROAT: No tinnitus or ear pain.  RESPIRATORY: No cough, shortness of breath, wheezing or hemoptysis.  CARDIOVASCULAR: No chest pain, orthopnea, edema.  GASTROINTESTINAL: +nausea, vomiting, abdominal pain.  GENITOURINARY: No  dysuria, hematuria.  ENDOCRINE: No polyuria, nocturia,  HEMATOLOGY: No anemia, easy bruising or bleeding SKIN: No rash or lesion. MUSCULOSKELETAL: No joint pain or arthritis.   NEUROLOGIC: No tingling, numbness, weakness.  PSYCHIATRY: No anxiety or depression.   MEDICATIONS AT HOME:  Prior to Admission medications   Medication Sig Start Date End Date Taking? Authorizing Provider  aspirin 325 MG tablet Take 325 mg by mouth daily.   Yes [provider]  isosorbide mononitrate (IMDUR) 30 MG 24 hr tablet Take 1 tablet (30 mg total) by mouth daily. 04/20/18  Yes Katha Hamming, MD  NIFEdipine (PROCARDIA-XL/ADALAT CC) 30 MG 24 hr tablet Take 30 mg by mouth daily.   Yes [provider]  cloNIDine (CATAPRES) 0.2 MG tablet Take 1 tablet (0.2 mg total) by mouth 3 (three) times daily. Patient not taking: Reported on 11/18/2018 04/18/18   Katha Hamming, MD  sucralfate (CARAFATE) 1 g tablet Take 1 tablet (1 g total) by mouth 4 (four) times daily. Patient not taking: Reported on 11/18/2018 06/08/18   Phineas Semen, MD      PHYSICAL EXAMINATION:   VITAL SIGNS: Blood pressure (!) 170/117, pulse (!) 115, temperature 98.4 F (36.9 C), temperature source Oral, resp. rate 18, height 5\' 11"  (1.803 m), weight 79.4 kg, SpO2 100 %.  GENERAL:  48 y.o.-year-old patient lying in the bed with no acute distress.  EYES: Pupils equal, round, reactive to light and accommodation. No scleral icterus. Extraocular muscles intact.  HEENT: Head atraumatic, normocephalic. Oropharynx and nasopharynx clear.  NECK:  Supple, no jugular venous distention. No thyroid enlargement, no tenderness.  LUNGS: Normal breath sounds bilaterally, no wheezing, rales,rhonchi or crepitation. No use of accessory muscles of respiration.  CARDIOVASCULAR: S1, S2 normal. No murmurs, rubs, or gallops.  ABDOMEN: Soft, nontender, nondistended. Bowel sounds present. No organomegaly or mass.  EXTREMITIES: No pedal edema,  cyanosis, or clubbing.  NEUROLOGIC: Cranial nerves II through XII are intact. Muscle strength 5/5 in all extremities. Sensation intact. Gait not checked.  PSYCHIATRIC: The patient is alert and oriented x 3.  SKIN: No obvious rash, lesion, or ulcer.   LABORATORY PANEL:   CBC Recent Labs  Lab 11/18/18 1642  WBC 11.2*  HGB 15.7  HCT 46.1  PLT 186  MCV 89.0  MCH 30.3  MCHC 34.1  RDW 13.2   ------------------------------------------------------------------------------------------------------------------  Chemistries  Recent Labs  Lab 11/18/18 1642  NA 133*  K 4.0  CL 95*  CO2 25  GLUCOSE 116*  BUN 16  CREATININE 1.01  CALCIUM 8.7*  AST 44*  ALT 38  ALKPHOS 72  BILITOT 0.6   ------------------------------------------------------------------------------------------------------------------ estimated creatinine clearance is 98.4 mL/min (by C-G formula based on SCr of 1.01 mg/dL). ------------------------------------------------------------------------------------------------------------------ No results for input(s): TSH, T4TOTAL, T3FREE, THYROIDAB in the last 72 hours.  Invalid input(s): FREET3   Coagulation profile No results for input(s): INR, PROTIME in the last 168 hours. ------------------------------------------------------------------------------------------------------------------- No results for input(s): DDIMER in the last 72 hours. -------------------------------------------------------------------------------------------------------------------  Cardiac Enzymes No results for input(s): CKMB, TROPONINI, MYOGLOBIN in the last 168 hours.  Invalid input(s): CK ------------------------------------------------------------------------------------------------------------------ Invalid input(s): POCBNP  ---------------------------------------------------------------------------------------------------------------  Urinalysis    Component Value Date/Time    COLORURINE YELLOW (A) 11/18/2018 1642   APPEARANCEUR CLEAR (A) 11/18/2018 1642   LABSPEC 1.021 11/18/2018 1642   PHURINE 5.0 11/18/2018 1642   GLUCOSEU NEGATIVE 11/18/2018 1642   HGBUR NEGATIVE 11/18/2018 1642   BILIRUBINUR NEGATIVE 11/18/2018 1642   KETONESUR NEGATIVE 11/18/2018 1642   PROTEINUR 30 (A) 11/18/2018 1642   NITRITE NEGATIVE 11/18/2018 1642   LEUKOCYTESUR NEGATIVE 11/18/2018 1642     RADIOLOGY: Ct Abdomen Pelvis W Contrast  Result Date: 11/18/2018 CLINICAL DATA:  Significant epigastric and left upper quadrant pain in the context of elevated lipase and alcohol abuse. Pain onset this morning. EXAM: CT ABDOMEN AND PELVIS WITH CONTRAST TECHNIQUE: Multidetector CT imaging of the abdomen and pelvis was performed using the standard protocol following bolus administration of intravenous contrast. CONTRAST:  100mL ISOVUE-300 IOPAMIDOL (ISOVUE-300) INJECTION 61% COMPARISON:  None. FINDINGS: Lower chest: Subsegmental atelectasis in both lower lobes. No confluent airspace disease. No pleural effusion. Hepatobiliary: Decreased hepatic density consistent with steatosis. No focal hepatic abnormality. Gallbladder physiologically distended, no calcified stone. No biliary dilatation. Pancreas: Peripancreatic fat stranding extending from the mid body through the tail, at least moderate in degree. Peripancreatic fluid tracks into the left upper quadrant and left pericolic gutter. Slightly ovoid decreased density in the region of the pancreatic tail may be pancreatic fluid or nonenhancement/necrosis, image 34 series 2. The remainder of the pancreas enhances homogeneously. No pancreatic ductal dilatation. Spleen: Normal in size without focal abnormality. Perisplenic fluid tracking from pancreatic inflammation. The splenic vein is patent. Adrenals/Urinary Tract: Normal adrenal glands. No hydronephrosis or perinephric edema. Homogeneous renal enhancement with symmetric excretion on delayed phase imaging.  Urinary bladder is physiologically distended without wall thickening. Stomach/Bowel: Patulous distal esophagus. Stomach distended with ingested contrast. No small bowel dilatation, inflammation or obstruction. Appendix is normal. Mild wall thickening about the splenic flexure of the colon is  likely reactive secondary to pancreatic inflammation. Small volume of colonic stool. Vascular/Lymphatic: Mild aortic atherosclerosis. No aneurysm. Retroaortic left renal vein. Multiple prominent peripancreatic nodes are likely reactive. The splenic and portal veins are patent. Mesenteric branches appear patent. Reproductive: Prostate is unremarkable. Other: Peripancreatic stranding with free fluid tracking into the left upper quadrant and left pericolic gutter. Mild amount of mesenteric edema in the left upper quadrant. Small volume of free fluid tracks into the pelvis. No free air or organized collection. Soft tissue thickening in the inguinal canals, greater on the right. Musculoskeletal: Hemi transitional lumbosacral anatomy with enlarged left transverse process and pseudoarticulation with the sacrum. There are no acute or suspicious osseous abnormalities. IMPRESSION: 1. Acute pancreatitis with moderate peripancreatic inflammation and free fluid. Slightly ovoid decreased density in the region of the pancreatic tail may be peripancreatic fluid or nonenhancement/necrosis. The remainder of the pancreas enhances homogeneously. 2. Hepatic steatosis. 3. Colonic wall thickening at the splenic flexure is likely reactive. 4.  Aortic Atherosclerosis (ICD10-I70.0). Electronically Signed   By: Narda Rutherford M.D.   On: 11/18/2018 20:44    EKG: Orders placed or performed during the hospital encounter of 06/08/18  . ED EKG  . ED EKG  . EKG 12-Lead  . EKG 12-Lead    IMPRESSION AND PLAN: *Acute abdominal pain *Acute pancreatitis with ?  Area of necrosis, most likely secondary to chronic alcoholism *Chronic alcoholism with  history of DUI *Acute hyponatremia/hypochloremia *Chronic tobacco smoker abuse/dependency *Acute sinus tachycardia *Acute hypertension exacerbated by pain  Admit to regular nursing for bed, adult pain protocol, consult general surgery for expert opinion, check abdominal ultrasound for further evaluation, check lipids in the morning, n.p.o. except for meds, alcohol withdrawal protocol while in house, IV fluids for rehydration, tobacco cessation counseling ordered, nicotine patch daily, check urine drug screen, check alcohol level, IV hydralazine as needed systolic blood pressure greater than 160, vitals per routine, make changes as per necessary, IV Protonix daily, continue close medical monitoring   All the records are reviewed and case discussed with ED provider. Management plans discussed with the patient, family and they are in agreement.  CODE STATUS:full Code Status History    Date Active Date Inactive Code Status Order ID Comments User Context   04/16/2018 1215 04/19/2018 1920 Full Code [id number]  Ihor Austin, MD Inpatient       TOTAL TIME TAKING CARE OF THIS PATIENT: 40 minutes.    Evelena Asa Salary M.D on 11/18/2018   Between 7am to 6pm - Pager - [phone]  After 6pm go to [email or web address] - Social research officer, government  [facility]  Office  [phone]  CC: Primary care physician; Services, [facility]   Note: This dictation was prepared with Dragon dictation along with smaller phrase technology. Any transcriptional errors that result from this process are unintentional.

## 2018-11-18 NOTE — ED Notes (Signed)
Pt to desk hunched over holding abd and reporting the pain is worsening. RN offered pain medications and pt agreed to take some.

## 2018-11-19 ENCOUNTER — Inpatient Hospital Stay: Payer: Self-pay

## 2018-11-19 DIAGNOSIS — K8689 Other specified diseases of pancreas: Secondary | ICD-10-CM

## 2018-11-19 LAB — LIPASE, BLOOD: Lipase: 67 U/L — ABNORMAL HIGH (ref 11–51)

## 2018-11-19 LAB — COMPREHENSIVE METABOLIC PANEL
ALT: 29 U/L (ref 0–44)
AST: 33 U/L (ref 15–41)
Albumin: 3.7 g/dL (ref 3.5–5.0)
Alkaline Phosphatase: 63 U/L (ref 38–126)
Anion gap: 9 (ref 5–15)
BUN: 11 mg/dL (ref 6–20)
CHLORIDE: 98 mmol/L (ref 98–111)
CO2: 25 mmol/L (ref 22–32)
Calcium: 8.4 mg/dL — ABNORMAL LOW (ref 8.9–10.3)
Creatinine, Ser: 0.99 mg/dL (ref 0.61–1.24)
GFR calc Af Amer: >60 mL/min (ref >60)
GFR calc non Af Amer: >60 mL/min (ref >60)
Glucose, Bld: 113 mg/dL — ABNORMAL HIGH (ref 70–99)
Potassium: 3.6 mmol/L (ref 3.5–5.1)
SODIUM: 132 mmol/L — AB (ref 135–145)
Total Bilirubin: 1.3 mg/dL — ABNORMAL HIGH (ref 0.3–1.2)
Total Protein: 7.2 g/dL (ref 6.5–8.1)

## 2018-11-19 LAB — CBC
HCT: 44.2 % (ref 39.0–52.0)
Hemoglobin: 15.3 g/dL (ref 13.0–17.0)
MCH: 30.3 pg (ref 26.0–34.0)
MCHC: 34.6 g/dL (ref 30.0–36.0)
MCV: 87.5 fL (ref 80.0–100.0)
NRBC: 0 % (ref 0.0–0.2)
Platelets: 166 10*3/uL (ref 150–400)
RBC: 5.05 MIL/uL (ref 4.22–5.81)
RDW: 13.3 % (ref 11.5–15.5)
WBC: 11.1 10*3/uL — ABNORMAL HIGH (ref 4.0–10.5)

## 2018-11-19 LAB — LIPID PANEL
Cholesterol: 158 mg/dL (ref 0–200)
HDL: 33 mg/dL — ABNORMAL LOW (ref >40)
LDL Cholesterol: 68 mg/dL (ref 0–99)
Total CHOL/HDL Ratio: 4.8 RATIO
Triglycerides: 287 mg/dL — ABNORMAL HIGH (ref <150)
VLDL: 57 mg/dL — ABNORMAL HIGH (ref 0–40)

## 2018-11-19 LAB — URINE DRUG SCREEN, QUALITATIVE (ARMC ONLY)
Amphetamines, Ur Screen: NOT DETECTED
Barbiturates, Ur Screen: NOT DETECTED
Benzodiazepine, Ur Scrn: NOT DETECTED
CANNABINOID 50 NG, UR ~~LOC~~: NOT DETECTED
Cocaine Metabolite,Ur ~~LOC~~: NOT DETECTED
MDMA (Ecstasy)Ur Screen: NOT DETECTED
Methadone Scn, Ur: NOT DETECTED
Opiate, Ur Screen: NOT DETECTED
Phencyclidine (PCP) Ur S: NOT DETECTED
Tricyclic, Ur Screen: NOT DETECTED

## 2018-11-19 MED ORDER — SUCRALFATE 1 G PO TABS
1.0000 g | ORAL_TABLET | Freq: Four times a day (QID) | ORAL | Status: DC
  Administered 2018-11-19 – 2018-11-21 (×9): 1 g via ORAL
  Filled 2018-11-19 (×9): qty 1

## 2018-11-19 MED ORDER — POTASSIUM CHLORIDE CRYS ER 20 MEQ PO TBCR
40.0000 meq | EXTENDED_RELEASE_TABLET | Freq: Once | ORAL | Status: AC
  Administered 2018-11-19: 40 meq via ORAL
  Filled 2018-11-19: qty 2

## 2018-11-19 MED ORDER — ISOSORBIDE MONONITRATE ER 30 MG PO TB24
30.0000 mg | ORAL_TABLET | Freq: Every day | ORAL | Status: DC
  Administered 2018-11-19 – 2018-11-21 (×3): 30 mg via ORAL
  Filled 2018-11-19 (×3): qty 1

## 2018-11-19 MED ORDER — ONDANSETRON HCL 4 MG/2ML IJ SOLN
4.0000 mg | Freq: Four times a day (QID) | INTRAMUSCULAR | Status: DC | PRN
  Administered 2018-11-19: 4 mg via INTRAVENOUS
  Filled 2018-11-19: qty 2

## 2018-11-19 MED ORDER — HYDROCODONE-ACETAMINOPHEN 5-325 MG PO TABS
1.0000 | ORAL_TABLET | ORAL | Status: DC | PRN
  Administered 2018-11-19: 2 via ORAL
  Administered 2018-11-20: 1 via ORAL
  Filled 2018-11-19: qty 2
  Filled 2018-11-19: qty 1

## 2018-11-19 MED ORDER — MORPHINE SULFATE (PF) 2 MG/ML IV SOLN
2.0000 mg | INTRAVENOUS | Status: DC | PRN
  Administered 2018-11-19: 2 mg via INTRAVENOUS
  Filled 2018-11-19: qty 1

## 2018-11-19 MED ORDER — FOLIC ACID 1 MG PO TABS
1.0000 mg | ORAL_TABLET | Freq: Every day | ORAL | Status: DC
  Administered 2018-11-19 – 2018-11-21 (×3): 1 mg via ORAL
  Filled 2018-11-19 (×3): qty 1

## 2018-11-19 MED ORDER — NICOTINE 14 MG/24HR TD PT24
14.0000 mg | MEDICATED_PATCH | Freq: Every day | TRANSDERMAL | Status: DC
  Administered 2018-11-19 – 2018-11-21 (×3): 14 mg via TRANSDERMAL
  Filled 2018-11-19 (×3): qty 1

## 2018-11-19 MED ORDER — ACETAMINOPHEN 325 MG PO TABS
650.0000 mg | ORAL_TABLET | Freq: Four times a day (QID) | ORAL | Status: DC | PRN
  Administered 2018-11-19 – 2018-11-20 (×3): 650 mg via ORAL
  Filled 2018-11-19 (×3): qty 2

## 2018-11-19 MED ORDER — ENOXAPARIN SODIUM 40 MG/0.4ML ~~LOC~~ SOLN
40.0000 mg | SUBCUTANEOUS | Status: DC
  Administered 2018-11-19 – 2018-11-21 (×3): 40 mg via SUBCUTANEOUS
  Filled 2018-11-19 (×3): qty 0.4

## 2018-11-19 MED ORDER — ALBUTEROL SULFATE (2.5 MG/3ML) 0.083% IN NEBU: 2.5000 mg | INHALATION_SOLUTION | Freq: Four times a day (QID) | RESPIRATORY_TRACT | Status: DC | PRN

## 2018-11-19 MED ORDER — ALBUTEROL SULFATE (2.5 MG/3ML) 0.083% IN NEBU: 2.5000 mg | INHALATION_SOLUTION | Freq: Four times a day (QID) | RESPIRATORY_TRACT | Status: DC

## 2018-11-19 MED ORDER — SODIUM CHLORIDE 1 G PO TABS
2.0000 g | ORAL_TABLET | Freq: Three times a day (TID) | ORAL | Status: AC
  Administered 2018-11-19 (×3): 2 g via ORAL
  Filled 2018-11-19 (×3): qty 2

## 2018-11-19 MED ORDER — INFLUENZA VAC SPLIT QUAD 0.5 ML IM SUSY
0.5000 mL | PREFILLED_SYRINGE | INTRAMUSCULAR | Status: AC
  Administered 2018-11-21: 0.5 mL via INTRAMUSCULAR
  Filled 2018-11-19: qty 0.5

## 2018-11-19 MED ORDER — ONDANSETRON HCL 4 MG PO TABS: 4.0000 mg | ORAL_TABLET | Freq: Four times a day (QID) | ORAL | Status: DC | PRN

## 2018-11-19 MED ORDER — SODIUM CHLORIDE 0.9 % IV SOLN
INTRAVENOUS | Status: DC
  Administered 2018-11-19 – 2018-11-21 (×4): via INTRAVENOUS

## 2018-11-19 MED ORDER — THIAMINE HCL 100 MG/ML IJ SOLN
INTRAVENOUS | Status: DC
  Administered 2018-11-19 (×2): via INTRAVENOUS
  Filled 2018-11-19 (×4): qty 1000

## 2018-11-19 MED ORDER — PIPERACILLIN-TAZOBACTAM 3.375 G IVPB
3.3750 g | Freq: Three times a day (TID) | INTRAVENOUS | Status: DC
  Administered 2018-11-19 – 2018-11-21 (×6): 3.375 g via INTRAVENOUS
  Filled 2018-11-19 (×6): qty 50

## 2018-11-19 MED ORDER — CLONIDINE HCL 0.1 MG PO TABS
0.2000 mg | ORAL_TABLET | Freq: Three times a day (TID) | ORAL | Status: DC
  Administered 2018-11-19 – 2018-11-21 (×7): 0.2 mg via ORAL
  Filled 2018-11-19 (×7): qty 2

## 2018-11-19 MED ORDER — ADULT MULTIVITAMIN W/MINERALS CH
1.0000 | ORAL_TABLET | Freq: Every day | ORAL | Status: DC
  Administered 2018-11-19 – 2018-11-21 (×3): 1 via ORAL
  Filled 2018-11-19 (×3): qty 1

## 2018-11-19 MED ORDER — VITAMIN B-1 100 MG PO TABS
100.0000 mg | ORAL_TABLET | Freq: Every day | ORAL | Status: DC
  Administered 2018-11-20 – 2018-11-21 (×2): 100 mg via ORAL
  Filled 2018-11-19 (×3): qty 1

## 2018-11-19 MED ORDER — ACETAMINOPHEN 650 MG RE SUPP: 650.0000 mg | Freq: Four times a day (QID) | RECTAL | Status: DC | PRN

## 2018-11-19 MED ORDER — NIFEDIPINE ER OSMOTIC RELEASE 30 MG PO TB24
30.0000 mg | ORAL_TABLET | Freq: Every day | ORAL | Status: DC
  Administered 2018-11-19 – 2018-11-21 (×3): 30 mg via ORAL
  Filled 2018-11-19 (×3): qty 1

## 2018-11-19 MED ORDER — PANTOPRAZOLE SODIUM 40 MG IV SOLR
40.0000 mg | INTRAVENOUS | Status: DC
  Administered 2018-11-19 – 2018-11-21 (×3): 40 mg via INTRAVENOUS
  Filled 2018-11-19 (×3): qty 40

## 2018-11-19 MED ORDER — THIAMINE HCL 100 MG/ML IJ SOLN
100.0000 mg | Freq: Every day | INTRAMUSCULAR | Status: DC
  Administered 2018-11-19: 100 mg via INTRAVENOUS
  Filled 2018-11-19: qty 2

## 2018-11-19 NOTE — Consult Note (Signed)
Pharmacy Antibiotic Note  Evan Ramsey is a [age] y.o. male admitted on 11/18/2018 with intra-abdominal infection. Patient has acute pancreatitis with possible necrosis. Surgery consulted - he has no current indication for surgery. Pharmacy has been consulted for Zosyn dosing.  Plan: Will order Zosyn 3.375 h IV q8h (EI).  Height: 5\' 11"  (180.3 cm) Weight: 175 lb (79.4 kg) IBW/kg (Calculated) : 75.3  Temp (24hrs), Avg:99.4 F (37.4 C), Min:98.3 F (36.8 C), Max:101.6 F (38.7 C)  Recent Labs  Lab 11/18/18 1642 11/19/18 0533  WBC 11.2* 11.1*  CREATININE 1.01 0.99    Estimated Creatinine Clearance: 100.4 mL/min (by C-G formula based on SCr of 0.99 mg/dL).    No Known Allergies  Antimicrobials this admission: 1/4 Zosyn >>    Dose adjustments this admission: N/A  Microbiology results:  Thank you for allowing pharmacy to be a part of this patient's care.   Evan Ramsey, PharmD Pharmacy Resident  11/19/2018 3:53 PM

## 2018-11-19 NOTE — ED Notes (Signed)
Melissa, RN called for pt arriving

## 2018-11-19 NOTE — Consult Note (Signed)
Reason for Consult:pancreatitis Referring Physician: Salary, Montel.  Evan Ramsey is an 48 y.o. male.  HPI: He presented to the emergency department yesterday with abdominal pain.  Has a history of significant ethanol use and reports epigastric pain since the day prior.  Denies chest pain or shortness of breath.  He denies nausea or vomiting.  No diarrhea.  He tried some over-the-counter options such as Pepto-Bismol and aspirin, but did not receive relief.  CT scan demonstrated inflammation with possible necrosis at the tail of the pancreas.  General surgery is consulted for further evaluation and management recommendations.  Past Medical History:  Diagnosis Date  . Anxiety   . Hypertension     Past Surgical History:  Procedure Laterality Date  . none      Family History  Problem Relation Age of Onset  . Hypertension Mother   . Anxiety disorder Mother     Social History:  reports that he has been smoking cigars. He has never used smokeless tobacco. He reports current alcohol use of about 84.0 standard drinks of alcohol per week. He reports that he does not use drugs.  Allergies: No Known Allergies  Medications: I have reviewed the patient's current medications.  Results for orders placed or performed during the hospital encounter of 11/18/18 (from the past 48 hour(s))  Lipase, blood     Status: Abnormal   Collection Time: 11/18/18  4:42 PM  Result Value Ref Range   Lipase 77 (H) 11 - 51 U/L    Comment: Performed at [facility], [address] [place], [place] [address]  Comprehensive metabolic panel     Status: Abnormal   Collection Time: 11/18/18  4:42 PM  Result Value Ref Range   Sodium 133 (L) 135 - 145 mmol/L   Potassium 4.0 3.5 - 5.1 mmol/L   Chloride 95 (L) 98 - 111 mmol/L   CO2 25 22 - 32 mmol/L   Glucose, Bld 116 (H) 70 - 99 mg/dL   BUN 16 6 - 20 mg/dL   Creatinine, Ser 1.911.01 0.61 - 1.24 mg/dL   Calcium 8.7 (L) 8.9 - 10.3 mg/dL   Total Protein 8.0  6.5 - 8.1 g/dL   Albumin 4.3 3.5 - 5.0 g/dL   AST 44 (H) 15 - 41 U/L   ALT 38 0 - 44 U/L   Alkaline Phosphatase 72 38 - 126 U/L   Total Bilirubin 0.6 0.3 - 1.2 mg/dL   GFR calc non Af Amer >60 >60 mL/min   GFR calc Af Amer >60 >60 mL/min   Anion gap 13 5 - 15    Comment: Performed at [facility], [address] [place], [place] [address]  CBC     Status: Abnormal   Collection Time: 11/18/18  4:42 PM  Result Value Ref Range   WBC 11.2 (H) 4.0 - 10.5 K/uL   RBC 5.18 4.22 - 5.81 MIL/uL   Hemoglobin 15.7 13.0 - 17.0 g/dL   HCT 56.246.1 13.039.0 - 86.552.0 %   MCV 89.0 80.0 - 100.0 fL   MCH 30.3 26.0 - 34.0 pg   MCHC 34.1 30.0 - 36.0 g/dL   RDW 78.413.2 69.611.5 - 29.515.5 %   Platelets 186 150 - 400 K/uL   nRBC 0.0 0.0 - 0.2 %    Comment: Performed at [facility], [address] [place], [place] [address]  Urinalysis, Complete w Microscopic     Status: Abnormal   Collection Time: 11/18/18  4:42 PM  Result Value Ref Range   Color, Urine YELLOW (A) YELLOW   APPearance CLEAR (A) CLEAR   Specific Gravity, Urine 1.021 1.005 - 1.030   pH 5.0 5.0 - 8.0   Glucose, UA NEGATIVE NEGATIVE mg/dL   Hgb urine dipstick NEGATIVE NEGATIVE   Bilirubin Urine NEGATIVE NEGATIVE   Ketones, ur NEGATIVE NEGATIVE mg/dL   Protein, ur 30 (A) NEGATIVE mg/dL   Nitrite NEGATIVE NEGATIVE   Leukocytes, UA NEGATIVE NEGATIVE   RBC / HPF 0-5 0 - 5 RBC/hpf   WBC, UA 0-5 0 - 5 WBC/hpf   Bacteria, UA NONE SEEN NONE SEEN   Squamous Epithelial / LPF 0-5 0 - 5   Mucus PRESENT     Comment: Performed at [facility], [address] [place], [place] [address]  Ethanol     Status: Abnormal   Collection Time: 11/18/18  4:42 PM  Result Value Ref Range   Alcohol, Ethyl (B) 108 (H) <10 mg/dL    Comment: (NOTE) Lowest detectable limit for serum alcohol is 10 mg/dL. For medical purposes only. Performed at [facility], [address] [place], [place] [address]   Urine Drug Screen, Qualitative  [facility] only)     Status: None   Collection Time: 11/18/18  4:42 PM  Result Value Ref Range   Tricyclic, Ur Screen NONE DETECTED NONE DETECTED   Amphetamines, Ur Screen NONE DETECTED NONE DETECTED   MDMA (Ecstasy)Ur Screen NONE DETECTED NONE DETECTED   Cocaine Metabolite,Ur [place] NONE DETECTED NONE DETECTED   Opiate, Ur Screen NONE DETECTED NONE DETECTED   Phencyclidine (PCP) Ur S NONE DETECTED NONE DETECTED   Cannabinoid 50 Ng, Ur [place] NONE DETECTED NONE DETECTED   Barbiturates, Ur Screen NONE DETECTED NONE DETECTED   Benzodiazepine, Ur Scrn NONE DETECTED NONE DETECTED   Methadone Scn, Ur NONE DETECTED NONE DETECTED    Comment: (NOTE) Tricyclics + metabolites, urine    Cutoff 1000 ng/mL Amphetamines + metabolites, urine  Cutoff 1000 ng/mL MDMA (Ecstasy), urine              Cutoff 500 ng/mL Cocaine Metabolite, urine          Cutoff 300 ng/mL Opiate + metabolites, urine        Cutoff 300 ng/mL Phencyclidine (PCP), urine         Cutoff 25 ng/mL Cannabinoid, urine                 Cutoff 50 ng/mL Barbiturates + metabolites, urine  Cutoff 200 ng/mL Benzodiazepine, urine              Cutoff 200 ng/mL Methadone, urine                   Cutoff 300 ng/mL The urine drug screen provides only a preliminary, unconfirmed analytical test result and should not be used for non-medical purposes. Clinical consideration and professional judgment should be applied to any positive drug screen result due to possible interfering substances. A more specific alternate chemical method must be used in order to obtain a confirmed analytical result. Gas chromatography / mass spectrometry (GC/MS) is the preferred confirmat ory method. Performed at [facility], [address] [place], [place] [address]   Comprehensive metabolic panel     Status: Abnormal   Collection Time: 11/19/18  5:33 AM  Result Value Ref Range   Sodium 132 (L) 135 - 145 mmol/L   Potassium 3.6 3.5 - 5.1 mmol/L   Chloride 98 98 -  111  mmol/L   CO2 25 22 - 32 mmol/L   Glucose, Bld 113 (H) 70 - 99 mg/dL   BUN 11 6 - 20 mg/dL   Creatinine, Ser 1.19 0.61 - 1.24 mg/dL   Calcium 8.4 (L) 8.9 - 10.3 mg/dL   Total Protein 7.2 6.5 - 8.1 g/dL   Albumin 3.7 3.5 - 5.0 g/dL   AST 33 15 - 41 U/L   ALT 29 0 - 44 U/L   Alkaline Phosphatase 63 38 - 126 U/L   Total Bilirubin 1.3 (H) 0.3 - 1.2 mg/dL   GFR calc non Af Amer >60 >60 mL/min   GFR calc Af Amer >60 >60 mL/min   Anion gap 9 5 - 15    Comment: Performed at [facility], [address] [place], [place] [address]  CBC     Status: Abnormal   Collection Time: 11/19/18  5:33 AM  Result Value Ref Range   WBC 11.1 (H) 4.0 - 10.5 K/uL   RBC 5.05 4.22 - 5.81 MIL/uL   Hemoglobin 15.3 13.0 - 17.0 g/dL   HCT 95.6 21.3 - 08.6 %   MCV 87.5 80.0 - 100.0 fL   MCH 30.3 26.0 - 34.0 pg   MCHC 34.6 30.0 - 36.0 g/dL   RDW 57.8 46.9 - 62.9 %   Platelets 166 150 - 400 K/uL   nRBC 0.0 0.0 - 0.2 %    Comment: Performed at [facility], [address] [place], [place] [address]  Lipid panel     Status: Abnormal   Collection Time: 11/19/18  5:33 AM  Result Value Ref Range   Cholesterol 158 0 - 200 mg/dL   Triglycerides 324 (H) <150 mg/dL   HDL 33 (L) >40 mg/dL   Total CHOL/HDL Ratio 4.8 RATIO   VLDL 57 (H) 0 - 40 mg/dL   LDL Cholesterol 68 0 - 99 mg/dL    Comment:        Total Cholesterol/HDL:CHD Risk Coronary Heart Disease Risk Table                     Men   Women  1/2 Average Risk   3.4   3.3  Average Risk       5.0   4.4  2 X Average Risk   9.6   7.1  3 X Average Risk  23.4   11.0        Use the calculated Patient Ratio above and the CHD Risk Table to determine the patient's CHD Risk.        ATP III CLASSIFICATION (LDL):  <100     mg/dL   Optimal  102-725  mg/dL   Near or Above                    Optimal  130-159  mg/dL   Borderline  366-440  mg/dL   High  >347     mg/dL   Very High Performed at [facility], [address]  [place], [place] [address]   Lipase, blood     Status: Abnormal   Collection Time: 11/19/18  5:33 AM  Result Value Ref Range   Lipase 67 (H) 11 - 51 U/L    Comment: Performed at [facility], [address] [place], [place] [address]    US Abdomen Complete  Result Date: 11/19/2018 CLINICAL DATA:  48 year old male with pancreatic necrosis EXAM: ABDOMEN ULTRASOUND COMPLETE COMPARISON:  CT scan of the abdomen and pelvis 11/18/2018 FINDINGS: Gallbladder: No gallstones or wall thickening visualized. No sonographic Murphy sign noted by sonographer. Common bile duct: Diameter: Within normal limits at 6 mm Liver: No focal lesion identified. Within normal limits in parenchymal echogenicity. Portal vein is patent on color Doppler imaging with normal direction of blood flow towards the liver. IVC: No abnormality visualized. Pancreas: Not well visualized secondary to obscuring bowel gas. Spleen: Size and appearance within normal limits. Right Kidney: Length: 10.8 cm. Echogenicity within normal limits. No mass or hydronephrosis visualized. Left Kidney: Length: 11.2 cm. Echogenicity within normal limits. No mass or hydronephrosis visualized. Abdominal aorta: Not well seen secondary to obscuring bowel gas. Other findings: Trace perihepatic ascites. IMPRESSION: 1. Trace ascites. 2. Nonvisualization of the pancreas secondary to obscuring bowel gas. 3. No evidence of biliary ductal dilatation or cholelithiasis. Electronically Signed   By: Malachy Moan M.D.   On: 11/19/2018 08:57   Ct Abdomen Pelvis W Contrast  Result Date: 11/18/2018 CLINICAL DATA:  Significant epigastric and left upper quadrant pain in the context of elevated lipase and alcohol abuse. Pain onset this morning. EXAM: CT ABDOMEN AND PELVIS WITH CONTRAST TECHNIQUE: Multidetector CT imaging of the abdomen and pelvis was performed using the standard protocol following bolus administration of intravenous contrast. CONTRAST:  ISOVUE-300  IOPAMIDOL (ISOVUE-300) INJECTION 61% COMPARISON:  None. FINDINGS: Lower chest: Subsegmental atelectasis in both lower lobes. No confluent airspace disease. No pleural effusion. Hepatobiliary: Decreased hepatic density consistent with steatosis. No focal hepatic abnormality. Gallbladder physiologically distended, no calcified stone. No biliary dilatation. Pancreas: Peripancreatic fat stranding extending from the mid body through the tail, at least moderate in degree. Peripancreatic fluid tracks into the left upper quadrant and left pericolic gutter. Slightly ovoid decreased density in the region of the pancreatic tail may be pancreatic fluid or nonenhancement/necrosis, image 34 series 2. The remainder of the pancreas enhances homogeneously. No pancreatic ductal dilatation. Spleen: Normal in size without focal abnormality. Perisplenic fluid tracking from pancreatic inflammation. The splenic vein is patent. Adrenals/Urinary Tract: Normal adrenal glands. No hydronephrosis or perinephric edema. Homogeneous renal enhancement with symmetric excretion on delayed phase imaging. Urinary bladder is physiologically distended without wall thickening. Stomach/Bowel: Patulous distal esophagus. Stomach distended with ingested contrast. No small bowel dilatation, inflammation or obstruction. Appendix is normal. Mild wall thickening about the splenic flexure of the colon is likely reactive secondary to pancreatic inflammation. Small volume of colonic stool. Vascular/Lymphatic: Mild aortic atherosclerosis. No aneurysm. Retroaortic left renal vein. Multiple prominent peripancreatic nodes are likely reactive. The splenic and portal veins are patent. Mesenteric branches appear patent. Reproductive: Prostate is unremarkable. Other: Peripancreatic stranding with free fluid tracking into the left upper quadrant and left pericolic gutter. Mild amount of mesenteric edema in the left upper quadrant. Small volume of free fluid tracks into the  pelvis. No free air or organized collection. Soft tissue thickening in the inguinal canals, greater on the right. Musculoskeletal: Hemi transitional lumbosacral anatomy with enlarged left transverse process and pseudoarticulation with the sacrum. There are no acute or suspicious osseous abnormalities. IMPRESSION: 1. Acute pancreatitis with moderate peripancreatic inflammation and free fluid. Slightly ovoid decreased density in the region of the pancreatic tail may be peripancreatic fluid or nonenhancement/necrosis. The remainder of the pancreas enhances homogeneously. 2. Hepatic steatosis. 3. Colonic wall thickening at the splenic flexure is likely reactive. 4.  Aortic Atherosclerosis (ICD10-I70.0). Electronically Signed   By: Narda Rutherford M.D.   On: 11/18/2018 20:44    Review of  Systems  All other systems reviewed and are negative.  Blood pressure 95/65, pulse (!) 103, temperature 98.3 F (36.8 C), temperature source Oral, resp. rate 18, height 5\' 11"  (1.803 m), weight 79.4 kg, SpO2 96 %. Physical Exam  Constitutional: He is oriented to person, place, and time. He appears well-developed and well-nourished. No distress.  HENT:  Head: Normocephalic and atraumatic.  Mouth/Throat: No oropharyngeal exudate.  Eyes: Pupils are equal, round, and reactive to light. Right eye exhibits no discharge. Left eye exhibits no discharge. No scleral icterus.  Neck: Normal range of motion. No tracheal deviation present. No thyromegaly present.  Cardiovascular: Regular rhythm.  tachycardic  Respiratory: Effort normal.  GI: Soft. He exhibits distension. He exhibits no mass. There is abdominal tenderness. There is no rebound and no guarding.  Mild tenderness and minimal distension.  Musculoskeletal:        General: No deformity or edema.  Lymphadenopathy:    He has no cervical adenopathy.  Neurological: He is alert and oriented to person, place, and time.  Skin: Skin is warm and dry.  Psychiatric: He has a  normal mood and affect.    Assessment/Plan: This is a 48 year old man admitted with abdominal pain and radiographic evidence of necrotizing pancreatitis.  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  There is no acute indication for surgery.  I recommend supportive care with fluid resuscitation and electrolyte optimization.  Normally, we recommend that patients with acute pancreatitis remain n.p.o. for the initial 24 hours.  If his inflammatory markers and pain are improving, he may initiate a low-fat, low residue diet, so long as there is no worsening pain, ileus, nausea, or vomiting.  Generally speaking, these areas of necrosis are sterile.  I would not recommend aspiration due to the risk of seeding the site.  If there is concern for infected necrosis, recommend starting empiric antibiotics.  At this time, he overall appears stable.  Should he deteriorate, he may require pancreatic debridement.  He is certainly not at this point.  I would recommend that he have a repeat CT scan in 6 to 8 weeks, to evaluate for pancreatic pseudocyst.  Generally speaking, these areas of necrosis are sterile.  I would not recommend aspiration due to the risk of seeding the site.  If there is concern for infected necrosis, recommend starting empiric antibiotics.  At this time, he overall appears stable.  Should he deteriorate, he may require pancreatic debridement.  He is certainly not at this point.  Surgery will continue to follow.  Evan Ramsey 11/19/2018, 1:58 PM

## 2018-11-19 NOTE — ED Notes (Addendum)
Report finished , calling for transport, Jann, NT to transport

## 2018-11-19 NOTE — Progress Notes (Signed)
Pt. arrived to the unit via stretcher. Oriented to room. Noted to be drowsy but awakens when name is called.  Instructed on plan of care, meds, and orders for the shift.  No complaints voiced at this time. Will continue to monitor.

## 2018-11-19 NOTE — Progress Notes (Signed)
Patient ID: Evan Ramsey, male   DOB: February 02, 1971, 48 y.o.   MRN: [id number]  [employer] PROGRESS NOTE  Evan Ramsey Evan Ramsey DOB: 04/25/1971 DOA: 11/18/2018 PCP: Services, [facility]  HPI/Subjective: Patient feeling better today than yesterday.  Patient states that he was on a medication starting with an ED for blood pressure that he developed a rash so he stopped taking it.  Patient states that he drinks alcohol sometimes but not every day.  Objective: Vitals:   11/19/18 0646 11/19/18 1140  BP: (!) 152/95 95/65  Pulse:  (!) 103  Resp:  18  Temp:  98.3 F (36.8 C)  SpO2: 98% 96%    Filed Weights   11/18/18 1631  Weight: 79.4 kg    ROS: Review of Systems  Constitutional: Negative for chills and fever.  Eyes: Negative for blurred vision.  Respiratory: Negative for cough and shortness of breath.   Cardiovascular: Negative for chest pain.  Gastrointestinal: Positive for abdominal pain. Negative for constipation, diarrhea, nausea and vomiting.  Genitourinary: Negative for dysuria.  Musculoskeletal: Negative for joint pain.  Neurological: Negative for dizziness and headaches.   Exam: Physical Exam  Constitutional: He is oriented to person, place, and time.  HENT:  Nose: No mucosal edema.  Mouth/Throat: No oropharyngeal exudate or posterior oropharyngeal edema.  Eyes: Pupils are equal, round, and reactive to light. Conjunctivae, EOM and lids are normal.  Neck: No JVD present. Carotid bruit is not present. No edema present. No thyroid mass and no thyromegaly present.  Cardiovascular: S1 normal and S2 normal. Exam reveals no gallop.  No murmur heard. Pulses:      Dorsalis pedis pulses are 2+ on the right side and 2+ on the left side.  Respiratory: No respiratory distress. He has no wheezes. He has no rhonchi. He has no rales.  GI: Soft. Bowel sounds are normal. There is abdominal tenderness.  Musculoskeletal:     Right ankle: He exhibits no swelling.      Left ankle: He exhibits no swelling.  Lymphadenopathy:    He has no cervical adenopathy.  Neurological: He is alert and oriented to person, place, and time. No cranial nerve deficit.  Skin: Skin is warm. No rash noted. Nails show no clubbing.  Psychiatric: He has a normal mood and affect.      Data Reviewed: Basic Metabolic Panel: Recent Labs  Lab 11/18/18 1642 11/19/18 0533  NA 133* 132*  K 4.0 3.6  CL 95* 98  CO2 25 25  GLUCOSE 116* 113*  BUN 16 11  CREATININE 1.01 0.99  CALCIUM 8.7* 8.4*   Liver Function Tests: Recent Labs  Lab 11/18/18 1642 11/19/18 0533  AST 44* 33  ALT 38 29  ALKPHOS 72 63  BILITOT 0.6 1.3*  PROT 8.0 7.2  ALBUMIN 4.3 3.7   Recent Labs  Lab 11/18/18 1642 11/19/18 0533  LIPASE 77* 67*   CBC: Recent Labs  Lab 11/18/18 1642 11/19/18 0533  WBC 11.2* 11.1*  HGB 15.7 15.3  HCT 46.1 44.2  MCV 89.0 87.5  PLT 186 166     Studies: US Abdomen Complete  Result Date: 11/19/2018 CLINICAL DATA:  48 year old male with pancreatic necrosis EXAM: ABDOMEN ULTRASOUND COMPLETE COMPARISON:  CT scan of the abdomen and pelvis 11/18/2018 FINDINGS: Gallbladder: No gallstones or wall thickening visualized. No sonographic Murphy sign noted by sonographer. Common bile duct: Diameter: Within normal limits at 6 mm Liver: No focal lesion identified. Within normal limits in parenchymal echogenicity. Portal vein is patent on  color Doppler imaging with normal direction of blood flow towards the liver. IVC: No abnormality visualized. Pancreas: Not well visualized secondary to obscuring bowel gas. Spleen: Size and appearance within normal limits. Right Kidney: Length: 10.8 cm. Echogenicity within normal limits. No mass or hydronephrosis visualized. Left Kidney: Length: 11.2 cm. Echogenicity within normal limits. No mass or hydronephrosis visualized. Abdominal aorta: Not well seen secondary to obscuring bowel gas. Other findings: Trace perihepatic ascites. IMPRESSION: 1.  Trace ascites. 2. Nonvisualization of the pancreas secondary to obscuring bowel gas. 3. No evidence of biliary ductal dilatation or cholelithiasis. Electronically Signed   By: Malachy MoanHeath  McCullough M.D.   On: 11/19/2018 08:57   Ct Abdomen Pelvis W Contrast  Result Date: 11/18/2018 CLINICAL DATA:  Significant epigastric and left upper quadrant pain in the context of elevated lipase and alcohol abuse. Pain onset this morning. EXAM: CT ABDOMEN AND PELVIS WITH CONTRAST TECHNIQUE: Multidetector CT imaging of the abdomen and pelvis was performed using the standard protocol following bolus administration of intravenous contrast. CONTRAST:  100mL ISOVUE-300 IOPAMIDOL (ISOVUE-300) INJECTION 61% COMPARISON:  None. FINDINGS: Lower chest: Subsegmental atelectasis in both lower lobes. No confluent airspace disease. No pleural effusion. Hepatobiliary: Decreased hepatic density consistent with steatosis. No focal hepatic abnormality. Gallbladder physiologically distended, no calcified stone. No biliary dilatation. Pancreas: Peripancreatic fat stranding extending from the mid body through the tail, at least moderate in degree. Peripancreatic fluid tracks into the left upper quadrant and left pericolic gutter. Slightly ovoid decreased density in the region of the pancreatic tail may be pancreatic fluid or nonenhancement/necrosis, image 34 series 2. The remainder of the pancreas enhances homogeneously. No pancreatic ductal dilatation. Spleen: Normal in size without focal abnormality. Perisplenic fluid tracking from pancreatic inflammation. The splenic vein is patent. Adrenals/Urinary Tract: Normal adrenal glands. No hydronephrosis or perinephric edema. Homogeneous renal enhancement with symmetric excretion on delayed phase imaging. Urinary bladder is physiologically distended without wall thickening. Stomach/Bowel: Patulous distal esophagus. Stomach distended with ingested contrast. No small bowel dilatation, inflammation or  obstruction. Appendix is normal. Mild wall thickening about the splenic flexure of the colon is likely reactive secondary to pancreatic inflammation. Small volume of colonic stool. Vascular/Lymphatic: Mild aortic atherosclerosis. No aneurysm. Retroaortic left renal vein. Multiple prominent peripancreatic nodes are likely reactive. The splenic and portal veins are patent. Mesenteric branches appear patent. Reproductive: Prostate is unremarkable. Other: Peripancreatic stranding with free fluid tracking into the left upper quadrant and left pericolic gutter. Mild amount of mesenteric edema in the left upper quadrant. Small volume of free fluid tracks into the pelvis. No free air or organized collection. Soft tissue thickening in the inguinal canals, greater on the right. Musculoskeletal: Hemi transitional lumbosacral anatomy with enlarged left transverse process and pseudoarticulation with the sacrum. There are no acute or suspicious osseous abnormalities. IMPRESSION: 1. Acute pancreatitis with moderate peripancreatic inflammation and free fluid. Slightly ovoid decreased density in the region of the pancreatic tail may be peripancreatic fluid or nonenhancement/necrosis. The remainder of the pancreas enhances homogeneously. 2. Hepatic steatosis. 3. Colonic wall thickening at the splenic flexure is likely reactive. 4.  Aortic Atherosclerosis (ICD10-I70.0). Electronically Signed   By: Narda RutherfordMelanie  Sanford M.D.   On: 11/18/2018 20:44    Scheduled Meds: . cloNIDine  0.2 mg Oral TID  . enoxaparin (LOVENOX) injection  40 mg Subcutaneous Q24H  . folic acid  1 mg Oral Daily  . [START ON 11/20/2018] Influenza vac split quadrivalent PF  0.5 mL Intramuscular Tomorrow-1000  . isosorbide mononitrate  30 mg Oral  Daily  . multivitamin with minerals  1 tablet Oral Daily  . nicotine  14 mg Transdermal Daily  . NIFEdipine  30 mg Oral Daily  . pantoprazole (PROTONIX) IV  40 mg Intravenous Q24H  . sodium chloride  2 g Oral TID  .  sucralfate  1 g Oral QID  . thiamine  100 mg Oral Daily   Or  . thiamine  100 mg Intravenous Daily   Continuous Infusions: . banana bag IV 1000 mL 150 mL/hr at 11/19/18 1430    Assessment/Plan:  1. Acute pancreatitis with questionable necrosis.  Give IV fluid hydration.  Patient was n.p.o. for 24 hours and be started on clear liquid diet.  Pancreatitis could be secondary to enalapril or alcohol.  Triglycerides are normal range.  Start empiric antibiotic. 2. Hypertension on clonidine, nifedipine 3. Alcohol abuse on thiamine multivitamin and folic acid 4. Hypokalemia try to replace potassium orally  Code Status:     Code Status Orders  (From admission, onward)         Start     Ordered   11/19/18 0048  Full code  Continuous     11/19/18 0047        Code Status History    Date Active Date Inactive Code Status Order ID Comments User Context   04/16/2018 1215 04/19/2018 1920 Full Code [id number]  Ihor AustinPyreddy, Pavan, MD Inpatient     Disposition Plan: To be determined based on clinical course  Consultants:  General surgery  Antibiotics:  Empiric Zosyn  Time spent: 28 minutes  Luria Rosario [employer]  [employer]

## 2018-11-20 LAB — COMPREHENSIVE METABOLIC PANEL
ALT: 21 U/L (ref 0–44)
AST: 29 U/L (ref 15–41)
Albumin: 3.2 g/dL — ABNORMAL LOW (ref 3.5–5.0)
Alkaline Phosphatase: 49 U/L (ref 38–126)
Anion gap: 8 (ref 5–15)
BUN: 8 mg/dL (ref 6–20)
CO2: 24 mmol/L (ref 22–32)
Calcium: 8.1 mg/dL — ABNORMAL LOW (ref 8.9–10.3)
Chloride: 98 mmol/L (ref 98–111)
Creatinine, Ser: 1.19 mg/dL (ref 0.61–1.24)
GFR calc Af Amer: >60 mL/min (ref >60)
GFR calc non Af Amer: >60 mL/min (ref >60)
Glucose, Bld: 131 mg/dL — ABNORMAL HIGH (ref 70–99)
Potassium: 3.5 mmol/L (ref 3.5–5.1)
Sodium: 130 mmol/L — ABNORMAL LOW (ref 135–145)
Total Bilirubin: 1.8 mg/dL — ABNORMAL HIGH (ref 0.3–1.2)
Total Protein: 6.6 g/dL (ref 6.5–8.1)

## 2018-11-20 LAB — LIPASE, BLOOD: LIPASE: 33 U/L (ref 11–51)

## 2018-11-20 LAB — CBC
HCT: 37.5 % — ABNORMAL LOW (ref 39.0–52.0)
Hemoglobin: 12.4 g/dL — ABNORMAL LOW (ref 13.0–17.0)
MCH: 30.2 pg (ref 26.0–34.0)
MCHC: 33.1 g/dL (ref 30.0–36.0)
MCV: 91.2 fL (ref 80.0–100.0)
Platelets: 114 10*3/uL — ABNORMAL LOW (ref 150–400)
RBC: 4.11 MIL/uL — ABNORMAL LOW (ref 4.22–5.81)
RDW: 14 % (ref 11.5–15.5)
WBC: 12 10*3/uL — AB (ref 4.0–10.5)
nRBC: 0 % (ref 0.0–0.2)

## 2018-11-20 MED ORDER — POTASSIUM CHLORIDE CRYS ER 20 MEQ PO TBCR
40.0000 meq | EXTENDED_RELEASE_TABLET | Freq: Once | ORAL | Status: AC
  Administered 2018-11-20: 40 meq via ORAL
  Filled 2018-11-20: qty 2

## 2018-11-20 NOTE — Progress Notes (Signed)
Patient ID: Evan Ramsey, male   DOB: 27-Dec-1970, 48 y.o.   MRN: [id number]  [employer] PROGRESS NOTE  Evan Ramsey TMA:[id number] DOB: 1971/09/26 DOA: 11/18/2018 PCP: Services, [facility]  HPI/Subjective: Patient feeling better today.  Abdominal pain graded 3 out of 10 intensity.  Still having pain.  Tolerating liquid diet.  Had low-grade temperature overnight.  Objective: Vitals:   11/20/18 0443 11/20/18 1225  BP: 122/67 117/77  Pulse: (!) 110 97  Resp: 18 18  Temp: (!) 100.7 F (38.2 C) 98.6 F (37 C)  SpO2: 93% 99%    Filed Weights   11/18/18 1631  Weight: 79.4 kg    ROS: Review of Systems  Constitutional: Negative for chills and fever.  Eyes: Negative for blurred vision.  Respiratory: Negative for cough and shortness of breath.   Cardiovascular: Negative for chest pain.  Gastrointestinal: Positive for abdominal pain. Negative for constipation, diarrhea, nausea and vomiting.  Genitourinary: Negative for dysuria.  Musculoskeletal: Negative for joint pain.  Neurological: Negative for dizziness and headaches.   Exam: Physical Exam  Constitutional: He is oriented to person, place, and time.  HENT:  Nose: No mucosal edema.  Mouth/Throat: No oropharyngeal exudate or posterior oropharyngeal edema.  Eyes: Pupils are equal, round, and reactive to light. Conjunctivae, EOM and lids are normal.  Neck: No JVD present. Carotid bruit is not present. No edema present. No thyroid mass and no thyromegaly present.  Cardiovascular: S1 normal and S2 normal. Exam reveals no gallop.  No murmur heard. Pulses:      Dorsalis pedis pulses are 2+ on the right side and 2+ on the left side.  Respiratory: No respiratory distress. He has no wheezes. He has no rhonchi. He has no rales.  GI: Soft. Bowel sounds are normal. There is abdominal tenderness.  Musculoskeletal:     Right ankle: He exhibits no swelling.     Left ankle: He exhibits no swelling.  Lymphadenopathy:    He has  no cervical adenopathy.  Neurological: He is alert and oriented to person, place, and time. No cranial nerve deficit.  Skin: Skin is warm. No rash noted. Nails show no clubbing.  Psychiatric: He has a normal mood and affect.      Data Reviewed: Basic Metabolic Panel: Recent Labs  Lab 11/18/18 1642 11/19/18 0533 11/20/18 0631  NA 133* 132* 130*  K 4.0 3.6 3.5  CL 95* 98 98  CO2 25 25 24   GLUCOSE 116* 113* 131*  BUN 16 11 8   CREATININE 1.01 0.99 1.19  CALCIUM 8.7* 8.4* 8.1*   Liver Function Tests: Recent Labs  Lab 11/18/18 1642 11/19/18 0533 11/20/18 0631  AST 44* 33 29  ALT 38 29 21  ALKPHOS 72 63 49  BILITOT 0.6 1.3* 1.8*  PROT 8.0 7.2 6.6  ALBUMIN 4.3 3.7 3.2*   Recent Labs  Lab 11/18/18 1642 11/19/18 0533 11/20/18 0631  LIPASE 77* 67* 33   CBC: Recent Labs  Lab 11/18/18 1642 11/19/18 0533 11/20/18 0631  WBC 11.2* 11.1* 12.0*  HGB 15.7 15.3 12.4*  HCT 46.1 44.2 37.5*  MCV 89.0 87.5 91.2  PLT 186 166 114*     Studies: US Abdomen Complete  Result Date: 11/19/2018 CLINICAL DATA:  48 year old male with pancreatic necrosis EXAM: ABDOMEN ULTRASOUND COMPLETE COMPARISON:  CT scan of the abdomen and pelvis 11/18/2018 FINDINGS: Gallbladder: No gallstones or wall thickening visualized. No sonographic Murphy sign noted by sonographer. Common bile duct: Diameter: Within normal limits at 6 mm Liver: No focal  lesion identified. Within normal limits in parenchymal echogenicity. Portal vein is patent on color Doppler imaging with normal direction of blood flow towards the liver. IVC: No abnormality visualized. Pancreas: Not well visualized secondary to obscuring bowel gas. Spleen: Size and appearance within normal limits. Right Kidney: Length: 10.8 cm. Echogenicity within normal limits. No mass or hydronephrosis visualized. Left Kidney: Length: 11.2 cm. Echogenicity within normal limits. No mass or hydronephrosis visualized. Abdominal aorta: Not well seen secondary to  obscuring bowel gas. Other findings: Trace perihepatic ascites. IMPRESSION: 1. Trace ascites. 2. Nonvisualization of the pancreas secondary to obscuring bowel gas. 3. No evidence of biliary ductal dilatation or cholelithiasis. Electronically Signed   By: Malachy MoanHeath  McCullough M.D.   On: 11/19/2018 08:57   Ct Abdomen Pelvis W Contrast  Result Date: 11/18/2018 CLINICAL DATA:  Significant epigastric and left upper quadrant pain in the context of elevated lipase and alcohol abuse. Pain onset this morning. EXAM: CT ABDOMEN AND PELVIS WITH CONTRAST TECHNIQUE: Multidetector CT imaging of the abdomen and pelvis was performed using the standard protocol following bolus administration of intravenous contrast. CONTRAST:  100mL ISOVUE-300 IOPAMIDOL (ISOVUE-300) INJECTION 61% COMPARISON:  None. FINDINGS: Lower chest: Subsegmental atelectasis in both lower lobes. No confluent airspace disease. No pleural effusion. Hepatobiliary: Decreased hepatic density consistent with steatosis. No focal hepatic abnormality. Gallbladder physiologically distended, no calcified stone. No biliary dilatation. Pancreas: Peripancreatic fat stranding extending from the mid body through the tail, at least moderate in degree. Peripancreatic fluid tracks into the left upper quadrant and left pericolic gutter. Slightly ovoid decreased density in the region of the pancreatic tail may be pancreatic fluid or nonenhancement/necrosis, image 34 series 2. The remainder of the pancreas enhances homogeneously. No pancreatic ductal dilatation. Spleen: Normal in size without focal abnormality. Perisplenic fluid tracking from pancreatic inflammation. The splenic vein is patent. Adrenals/Urinary Tract: Normal adrenal glands. No hydronephrosis or perinephric edema. Homogeneous renal enhancement with symmetric excretion on delayed phase imaging. Urinary bladder is physiologically distended without wall thickening. Stomach/Bowel: Patulous distal esophagus. Stomach  distended with ingested contrast. No small bowel dilatation, inflammation or obstruction. Appendix is normal. Mild wall thickening about the splenic flexure of the colon is likely reactive secondary to pancreatic inflammation. Small volume of colonic stool. Vascular/Lymphatic: Mild aortic atherosclerosis. No aneurysm. Retroaortic left renal vein. Multiple prominent peripancreatic nodes are likely reactive. The splenic and portal veins are patent. Mesenteric branches appear patent. Reproductive: Prostate is unremarkable. Other: Peripancreatic stranding with free fluid tracking into the left upper quadrant and left pericolic gutter. Mild amount of mesenteric edema in the left upper quadrant. Small volume of free fluid tracks into the pelvis. No free air or organized collection. Soft tissue thickening in the inguinal canals, greater on the right. Musculoskeletal: Hemi transitional lumbosacral anatomy with enlarged left transverse process and pseudoarticulation with the sacrum. There are no acute or suspicious osseous abnormalities. IMPRESSION: 1. Acute pancreatitis with moderate peripancreatic inflammation and free fluid. Slightly ovoid decreased density in the region of the pancreatic tail may be peripancreatic fluid or nonenhancement/necrosis. The remainder of the pancreas enhances homogeneously. 2. Hepatic steatosis. 3. Colonic wall thickening at the splenic flexure is likely reactive. 4.  Aortic Atherosclerosis (ICD10-I70.0). Electronically Signed   By: Narda RutherfordMelanie  Sanford M.D.   On: 11/18/2018 20:44    Scheduled Meds: . cloNIDine  0.2 mg Oral TID  . enoxaparin (LOVENOX) injection  40 mg Subcutaneous Q24H  . folic acid  1 mg Oral Daily  . Influenza vac split quadrivalent PF  0.5 mL  Intramuscular Tomorrow-1000  . isosorbide mononitrate  30 mg Oral Daily  . multivitamin with minerals  1 tablet Oral Daily  . nicotine  14 mg Transdermal Daily  . NIFEdipine  30 mg Oral Daily  . pantoprazole (PROTONIX) IV  40 mg  Intravenous Q24H  . sucralfate  1 g Oral QID  . thiamine  100 mg Oral Daily   Or  . thiamine  100 mg Intravenous Daily   Continuous Infusions: . sodium chloride 125 mL/hr at 11/20/18 0935  . piperacillin-tazobactam (ZOSYN)  IV 3.375 g (11/20/18 1324)    Assessment/Plan:  1. Acute pancreatitis with questionable necrosis.  Continue IV fluid hydration.  Patient had low-grade temperature last night.  On empiric antibiotic.  Pancreatitis could be secondary to enalapril or alcohol.  Triglycerides are normal range.  Try to advance diet today.  If he has worsening pain will have to go back to n.p.o. 2. Hypertension on clonidine, nifedipine 3. Alcohol abuse on thiamine multivitamin and folic acid 4. Hypokalemia try to replace potassium orally  Code Status:     Code Status Orders  (From admission, onward)         Start     Ordered   11/19/18 0048  Full code  Continuous     11/19/18 0047        Code Status History    Date Active Date Inactive Code Status Order ID Comments User Context   04/16/2018 1215 04/19/2018 1920 Full Code [id number]  Ihor Austin, MD Inpatient     Disposition Plan: Likely discharge tomorrow  Consultants:  General surgery  Antibiotics:  Empiric Zosyn  Time spent: 27 minutes  Zahid Carneiro [employer]

## 2018-11-20 NOTE — Consult Note (Signed)
Seen on rounds this morning.  He was walking around the room with his IV pole.  He was initiated on a clear liquid diet yesterday and tolerated this without difficulty.  He denies any worsening of his abdominal pain.  No nausea or vomiting.  Antibiotics were initiated by the primary team.  Past Medical History:  Diagnosis Date  . Anxiety   . Hypertension    Past Surgical History:  Procedure Laterality Date  . none     Family History  Problem Relation Age of Onset  . Hypertension Mother   . Anxiety disorder Mother    Social History   Tobacco Use  . Smoking status: Current Every Day Smoker    Types: Cigars  . Smokeless tobacco: Never Used  Substance Use Topics  . Alcohol use: Yes    Alcohol/week: 84.0 standard drinks    Types: 84 Cans of beer per week  . Drug use: Never   Current Meds  Medication Sig  . aspirin 325 MG tablet Take 325 mg by mouth daily.  . isosorbide mononitrate (IMDUR) 30 MG 24 hr tablet Take 1 tablet (30 mg total) by mouth daily.  Marland Kitchen NIFEdipine (PROCARDIA-XL/ADALAT CC) 30 MG 24 hr tablet Take 30 mg by mouth daily.   No Known Allergies Vitals:   11/19/18 2007 11/20/18 0443  BP: 129/71 122/67  Pulse: (!) 124 (!) 110  Resp: 20 18  Temp:  (!) 100.7 F (38.2 C)  SpO2: 94% 93%   I/O last 3 completed shifts: In: 5379.9 [P.O.:870; I.V.:3409.9; IV Piggyback:1100] Out: 2075 [Urine:2075] Total I/O In: -  Out: 950 [Urine:950]  Physical Exam  Constitutional: He is oriented to person, place, and time. He appears well-developed and well-nourished. No distress.  HENT:  Head: Normocephalic and atraumatic.  Mouth/Throat: No oropharyngeal exudate.  Eyes: Right eye exhibits no discharge. Left eye exhibits no discharge. No scleral icterus.  Neck: Normal range of motion.  Cardiovascular:  Tachycardic   Pulmonary/Chest: Effort normal.  Abdominal: Soft.  Mildly distended, minimal mid-epigastric and LUQ tenderness.  No rebound or guarding.  Musculoskeletal:  Normal range of motion.        General: No edema.  Lymphadenopathy:    He has no cervical adenopathy.  Neurological: He is alert and oriented to person, place, and time.  Skin: Skin is warm and dry.  Psychiatric: He has a normal mood and affect.   Results for VERLIE, TOPPER (MRN [id number]) as of 11/20/2018 11:42  Ref. Range 11/20/2018 06:31  Sodium Latest Ref Range: 135 - 145 mmol/L 130 (L)  Potassium Latest Ref Range: 3.5 - 5.1 mmol/L 3.5  Chloride Latest Ref Range: 98 - 111 mmol/L 98  CO2 Latest Ref Range: 22 - 32 mmol/L 24  Glucose Latest Ref Range: 70 - 99 mg/dL 222 (H)  BUN Latest Ref Range: 6 - 20 mg/dL 8  Creatinine Latest Ref Range: 0.61 - 1.24 mg/dL 9.79  Calcium Latest Ref Range: 8.9 - 10.3 mg/dL 8.1 (L)  Anion gap Latest Ref Range: 5 - 15  8  Alkaline Phosphatase Latest Ref Range: 38 - 126 U/L 49  Albumin Latest Ref Range: 3.5 - 5.0 g/dL 3.2 (L)  Lipase Latest Ref Range: 11 - 51 U/L 33  AST Latest Ref Range: 15 - 41 U/L 29  ALT Latest Ref Range: 0 - 44 U/L 21  Total Protein Latest Ref Range: 6.5 - 8.1 g/dL 6.6  Total Bilirubin Latest Ref Range: 0.3 - 1.2 mg/dL 1.8 (H)  GFR, Est Non  African American Latest Ref Range: >60 mL/min >60  GFR, Est African American Latest Ref Range: >60 mL/min >60  WBC Latest Ref Range: 4.0 - 10.5 K/uL 12.0 (H)  RBC Latest Ref Range: 4.22 - 5.81 MIL/uL 4.11 (L)  Hemoglobin Latest Ref Range: 13.0 - 17.0 g/dL 44.3 (L)  HCT Latest Ref Range: 39.0 - 52.0 % 37.5 (L)  MCV Latest Ref Range: 80.0 - 100.0 fL 91.2  MCH Latest Ref Range: 26.0 - 34.0 pg 30.2  MCHC Latest Ref Range: 30.0 - 36.0 g/dL 15.4  RDW Latest Ref Range: 11.5 - 15.5 % 14.0  Platelets Latest Ref Range: 150 - 400 K/uL 114 (L)  nRBC Latest Ref Range: 0.0 - 0.2 % 0.0    Assessment and plan: This is a 48 year old man admitted with pancreatitis, thought to be secondary to excessive alcohol consumption.  Continues to be tachycardic, which may represent an ongoing inflammatory process  versus alcohol withdrawal syndrome.  He does appear to be on the appropriate withdrawal protocol.  His creatinine and bilirubin are up slightly from yesterday, his sodium is down.  Calcium is also down slightly in the setting of low albumin.  Corrected for hypoalbuminemia, his calcium is 8.7.  He continues to have an elevated white blood cell count.  This may represent ongoing inflammatory process versus infection.  At this time, there is no acute indication for surgery.  Would continue to slowly advance his diet as tolerated, keeping in mind he should be on a low-fat low residue diet.  Please obtain a CT scan in 6 to 8 weeks and have him follow-up in general surgery clinic at that time.  General surgery will sign off.

## 2018-11-20 NOTE — Consult Note (Signed)
Pharmacy Antibiotic Note  Evan Ramsey is a 48 y.o. male admitted on 11/18/2018 with intra-abdominal infection. Patient has acute pancreatitis- likely secondary to alcohol abuse with possible necrosis. Surgery consulted - he has no current indication for surgery. Pharmacy has been consulted for Zosyn dosing.  Plan: Zosyn 3.375 h IV q8h (EI).  Height: 5\' 11"  (180.3 cm) Weight: 175 lb (79.4 kg) IBW/kg (Calculated) : 75.3  Temp (24hrs), Avg:99.8 F (37.7 C), Min:98.6 F (37 C), Max:100.7 F (38.2 C)  Recent Labs  Lab 11/18/18 1642 11/19/18 0533 11/20/18 0631  WBC 11.2* 11.1* 12.0*  CREATININE 1.01 0.99 1.19    Estimated Creatinine Clearance: 83.5 mL/min (by C-G formula based on SCr of 1.19 mg/dL).    No Known Allergies  Antimicrobials this admission: Zosyn 1/4 >>    Dose adjustments this admission: N/A  Microbiology results: N/A  Thank you for allowing pharmacy to be a part of this patient's care.   Lynia Landry L 11/20/2018 3:12 PM

## 2018-11-21 LAB — BASIC METABOLIC PANEL
Anion gap: 5 (ref 5–15)
BUN: 6 mg/dL (ref 6–20)
CO2: 26 mmol/L (ref 22–32)
Calcium: 8.3 mg/dL — ABNORMAL LOW (ref 8.9–10.3)
Chloride: 105 mmol/L (ref 98–111)
Creatinine, Ser: 0.91 mg/dL (ref 0.61–1.24)
GFR calc Af Amer: >60 mL/min (ref >60)
GFR calc non Af Amer: >60 mL/min (ref >60)
Glucose, Bld: 103 mg/dL — ABNORMAL HIGH (ref 70–99)
Potassium: 3.4 mmol/L — ABNORMAL LOW (ref 3.5–5.1)
Sodium: 136 mmol/L (ref 135–145)

## 2018-11-21 LAB — LIPASE, BLOOD: Lipase: 75 U/L — ABNORMAL HIGH (ref 11–51)

## 2018-11-21 MED ORDER — CLONIDINE HCL 0.2 MG PO TABS: 0.2000 mg | ORAL_TABLET | Freq: Two times a day (BID) | ORAL | 1 refills | Status: AC

## 2018-11-21 MED ORDER — ACETAMINOPHEN 325 MG PO TABS: 650.0000 mg | ORAL_TABLET | Freq: Four times a day (QID) | ORAL | Status: AC | PRN

## 2018-11-21 MED ORDER — LEVOFLOXACIN 500 MG PO TABS: 500.0000 mg | ORAL_TABLET | Freq: Every day | ORAL | 0 refills | Status: AC

## 2018-11-21 MED ORDER — NICOTINE 14 MG/24HR TD PT24: MEDICATED_PATCH | TRANSDERMAL | 0 refills | Status: AC

## 2018-11-21 MED ORDER — THIAMINE HCL 100 MG PO TABS: 100.0000 mg | ORAL_TABLET | Freq: Every day | ORAL | 0 refills | Status: AC

## 2018-11-21 NOTE — Progress Notes (Signed)
Patient ID: Evan Ramsey  Patient admitted to [facility] on 11/18/2018.  Patient Discharged from hospital on 11/21/2018.  May return to work on Wednesday 11/23/2018.  Dr Alford Highland [phone]

## 2018-11-21 NOTE — Discharge Summary (Signed)
[facility] - [place] [facility]   PATIENT NAME: Evan Ramsey    MR#:  [id number]  DATE OF BIRTH:  03-21-1971  DATE OF ADMISSION:  11/18/2018 ADMITTING PHYSICIAN: Bertrum Sol, MD  DATE OF DISCHARGE: 11/21/2018  4:06 PM  PRIMARY CARE PHYSICIAN: Services, [facility]    ADMISSION DIAGNOSIS:  Alcohol-induced acute pancreatitis, unspecified complication status [K85.20]  DISCHARGE DIAGNOSIS:  Active Problems:   Pancreatitis   SECONDARY DIAGNOSIS:   Past Medical History:  Diagnosis Date  . Anxiety   . Hypertension     HOSPITAL COURSE:   1.  Acute pancreatitis with questionable area of necrosis.  The patient was given vigorous IV fluid hydration during the hospital course.  The patient had low-grade temperature and was on empiric antibiotic.  The temperature normalized.  Patient does drink alcohol.  He was advised to stop drinking alcohol.  Gallbladder imaging look normal.  Triglycerides normal range.  The patient thinks he was taking enalapril which could also cause pancreatitis.  I did prescribe Levaquin upon getting out of the hospital.  The patient had no pain upon discharge even though his lipase went up a little bit after starting solid food.  I advised that he must stay away from alcohol.  If he has further pain he can go back to without food for a meal or 2 and then start liquid diet.  I do recommend a CT scan of the abdomen and pelvis in about 6 weeks to see if a pseudocyst is forming or not. 2.  Hypertension on clonidine and nifedipine 3.  Alcohol abuse.  Advised to stop drinking alcohol completely.  Prescribed thiamine upon going home 4.  Hypokalemia.  Potassium replaced 5.  Hyponatremia improved with diet.  DISCHARGE CONDITIONS:   Satisfactory  CONSULTS OBTAINED:  General surgery  DRUG ALLERGIES:  No Known Allergies  DISCHARGE MEDICATIONS:   Allergies as of 11/21/2018   No Known Allergies     Medication List    TAKE these  medications   acetaminophen 325 MG tablet Commonly known as:  TYLENOL Take 2 tablets (650 mg total) by mouth every 6 (six) hours as needed for mild pain (or Fever >/= 101).   aspirin 325 MG tablet Take 325 mg by mouth daily.   cloNIDine 0.2 MG tablet Commonly known as:  CATAPRES Take 1 tablet (0.2 mg total) by mouth 2 (two) times daily. What changed:  when to take this   isosorbide mononitrate 30 MG 24 hr tablet Commonly known as:  IMDUR Take 1 tablet (30 mg total) by mouth daily.   levofloxacin 500 MG tablet Commonly known as:  LEVAQUIN Take 1 tablet (500 mg total) by mouth at bedtime.   nicotine 14 mg/24hr patch Commonly known as:  NICODERM CQ - dosed in mg/24 hours One patch chest wall daily (okay to substitute generic patch   NIFEdipine 30 MG 24 hr tablet Commonly known as:  ADALAT CC Take 30 mg by mouth daily.   sucralfate 1 g tablet Commonly known as:  CARAFATE Take 1 tablet (1 g total) by mouth 4 (four) times daily.   thiamine 100 MG tablet Take 1 tablet (100 mg total) by mouth daily.        DISCHARGE INSTRUCTIONS:   Follow-up with PMD 5 days  If you experience worsening of your admission symptoms, develop shortness of breath, life threatening emergency, suicidal or homicidal thoughts you must seek medical attention immediately by calling 911 or calling your MD immediately  if symptoms less severe.  You Must read complete instructions/literature along with all the possible adverse reactions/side effects for all the Medicines you take and that have been prescribed to you. Take any new Medicines after you have completely understood and accept all the possible adverse reactions/side effects.   Please note  You were cared for by a hospitalist during your hospital stay. If you have any questions about your discharge medications or the care you received while you were in the hospital after you are discharged, you can call the unit and asked to speak with the  hospitalist on call if the hospitalist that took care of you is not available. Once you are discharged, your primary care physician will handle any further medical issues. Please note that NO REFILLS for any discharge medications will be authorized once you are discharged, as it is imperative that you return to your primary care physician (or establish a relationship with a primary care physician if you do not have one) for your aftercare needs so that they can reassess your need for medications and monitor your lab values.    Today   CHIEF COMPLAINT:   Chief Complaint  Patient presents with  . Abdominal Pain    HISTORY OF PRESENT ILLNESS:  Evan Ramsey  is a 48 y.o. male presented with abdominal pain   VITAL SIGNS:  Blood pressure 118/84, pulse 91, temperature 99.1 F (37.3 C), temperature source Oral, resp. rate 18, height 5\' 11"  (1.803 m), weight 79.4 kg, SpO2 92 %.    PHYSICAL EXAMINATION:  GENERAL:  48 y.o.-year-old patient lying in the bed with no acute distress.  EYES: Pupils equal, round, reactive to light and accommodation. No scleral icterus. Extraocular muscles intact.  HEENT: Head atraumatic, normocephalic. Oropharynx and nasopharynx clear.  NECK:  Supple, no jugular venous distention. No thyroid enlargement, no tenderness.  LUNGS: Normal breath sounds bilaterally, no wheezing, rales,rhonchi or crepitation. No use of accessory muscles of respiration.  CARDIOVASCULAR: S1, S2 normal. No murmurs, rubs, or gallops.  ABDOMEN: Soft, non-tender, non-distended. Bowel sounds present. No organomegaly or mass.  EXTREMITIES: No pedal edema, cyanosis, or clubbing.  NEUROLOGIC: Cranial nerves II through XII are intact. Muscle strength 5/5 in all extremities. Sensation intact. Gait not checked.  PSYCHIATRIC: The patient is alert and oriented x 3.  SKIN: No obvious rash, lesion, or ulcer.   DATA REVIEW:   CBC Recent Labs  Lab 11/20/18 0631  WBC 12.0*  HGB 12.4*  HCT 37.5*   PLT 114*    Chemistries  Recent Labs  Lab 11/20/18 0631 11/21/18 0601  NA 130* 136  K 3.5 3.4*  CL 98 105  CO2 24 26  GLUCOSE 131* 103*  BUN 8 6  CREATININE 1.19 0.91  CALCIUM 8.1* 8.3*  AST 29  --   ALT 21  --   ALKPHOS 49  --   BILITOT 1.8*  --      Management plans discussed with the patient, family and they are in agreement.  CODE STATUS:     Code Status Orders  (From admission, onward)         Start     Ordered   11/19/18 0048  Full code  Continuous     11/19/18 0047        Code Status History    Date Active Date Inactive Code Status Order ID Comments User Context   04/16/2018 1215 04/19/2018 1920 Full Code [id number]  Ihor AustinPyreddy, Pavan, MD Inpatient  TOTAL TIME TAKING CARE OF THIS PATIENT: 35 minutes.    Alford Highland M.D on 11/21/2018 at 4:07 PM  Between 7am to 6pm - Pager - [phone]  After 6pm go to [email or web address] - Social research officer, government  [facility] Office  [phone]  CC: Primary care physician; Services, [facility]

## 2018-11-21 NOTE — Discharge Instructions (Signed)
Recommend repeat ct scan abdomen and pelvis in 6 weeks

## 2018-11-21 NOTE — Care Management Note (Signed)
Case Management Note  Patient Details  Name: Evan Ramsey MRN: [id number] Date of Birth: 11/09/1971   Patient to discharge home today.  PCP [employer].  [employer]  Provided patient with goodrx coupon.  Out of pocket cost $10.04.    Subjective/Objective:                    Action/Plan:   Expected Discharge Date:  11/21/18               Expected Discharge Plan:  Home/Self Care  In-House Referral:     Discharge planning Services  CM Consult, Medication Assistance  Post Acute Care Choice:    Choice offered to:     DME Arranged:    DME Agency:     HH Arranged:    HH Agency:     Status of Service:  Completed, signed off  If discussed at [employer] of Stay Meetings, dates discussed:    Additional Comments:  Chapman Fitch, RN 11/21/2018, 2:22 PM

## 2018-11-28 ENCOUNTER — Telehealth: Payer: Self-pay

## 2018-11-28 NOTE — Telephone Encounter (Signed)
Flagged on EMMI report for not having a follow up scheduled.  Called and spoke with patient.  His PCP is [facility].  Per AVS, had appointment for 19/2020 at 1:40pm, however patient did not make appointment due to work.  Encouraged him to call and reschedule.  Per patient, he was told he needed a CT scan in about 6 weeks, but did not know a date.  Upon chart review, Dr. Renae Gloss notes he recommends a CT, however did not see where one was scheduled.  Encouraged patient to follow up with his PCP and make them aware that a CT was recommended.  Patient verbalized understanding.  No other questions or concerns at this time.

## 2020-02-15 IMAGING — CR DG CHEST 2V
1 series · 2 of 2 positions shown · non-contrast
Comparison: None.

CLINICAL DATA: Chest pain

EXAM:
CHEST - 2 VIEW

[Series 1: dg chest 2 view · 0.14mm/px · 2 of 2 slices shown]
[im 1/2]
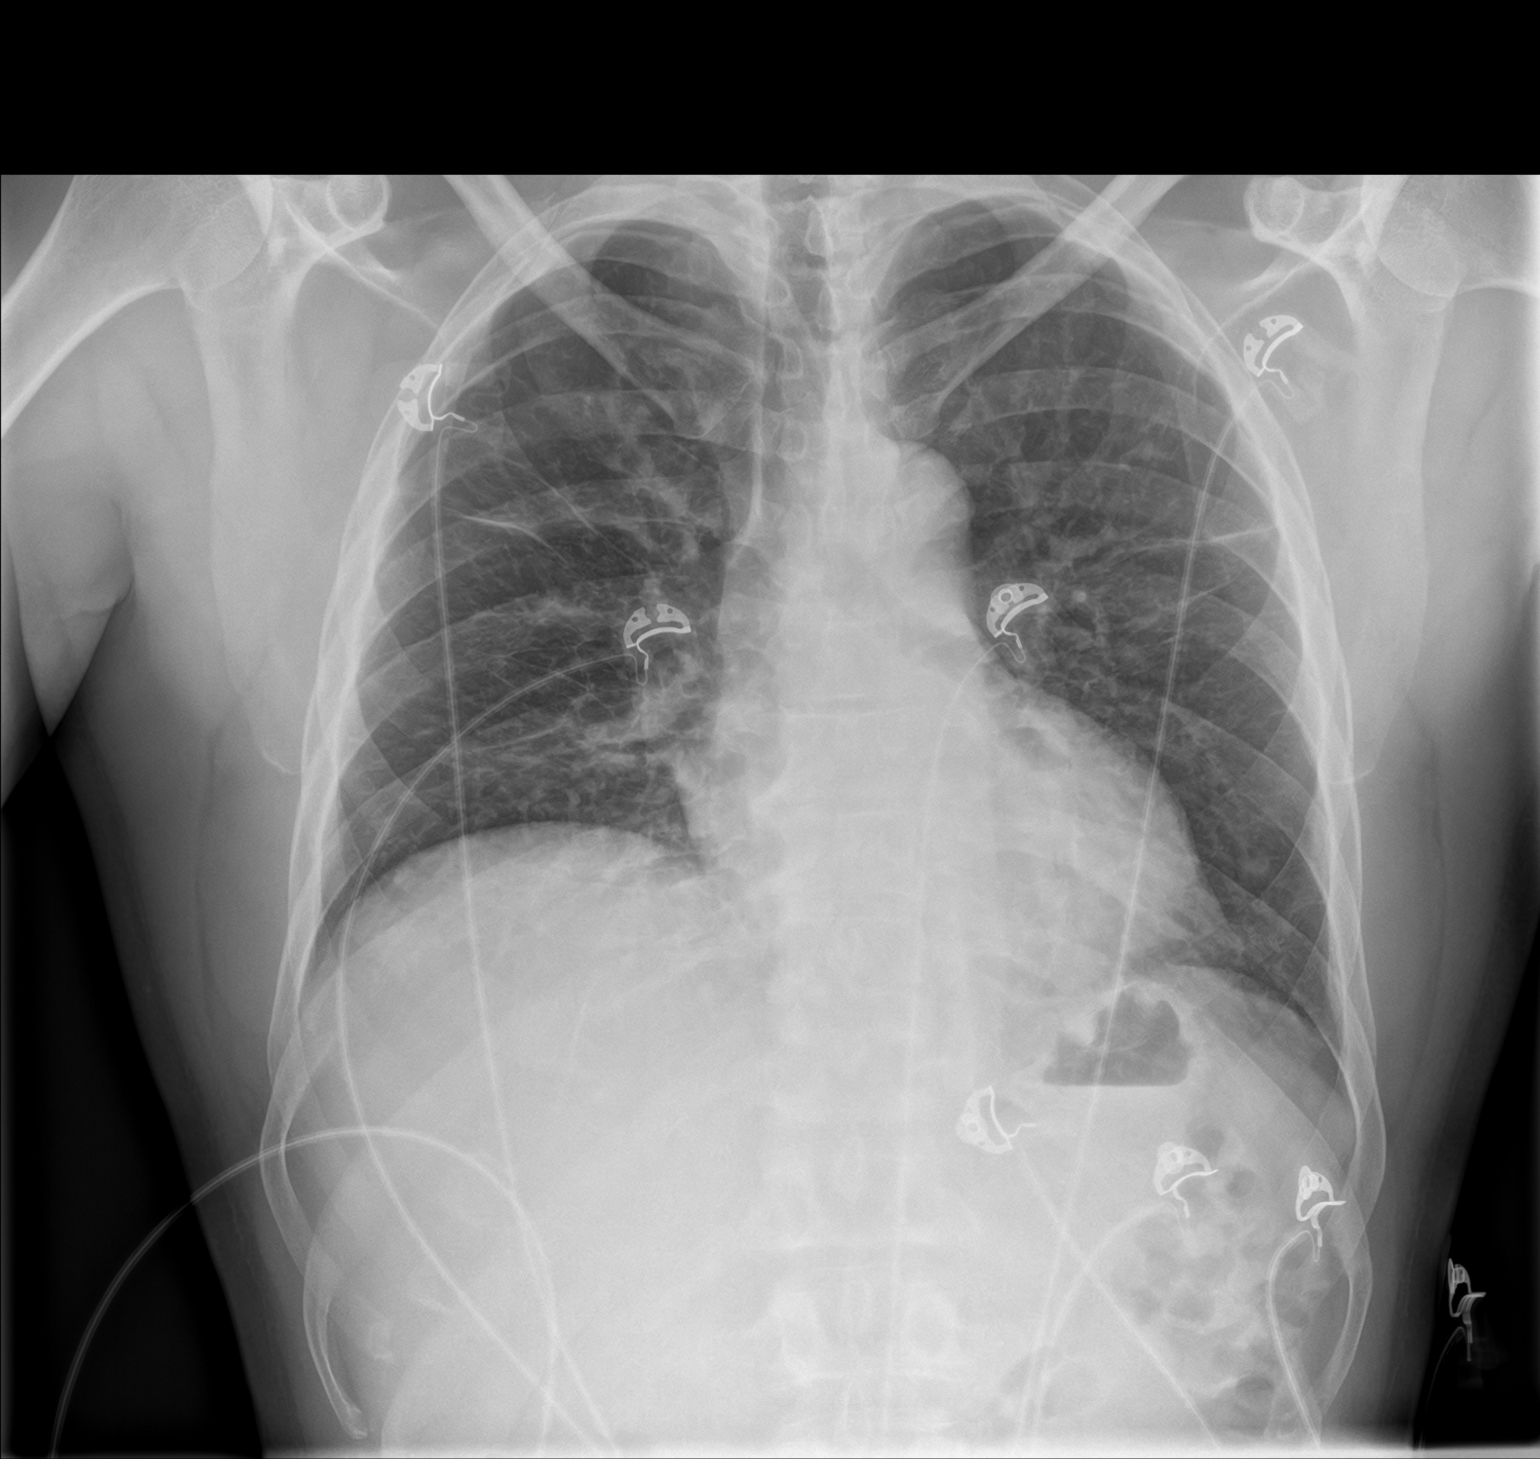
[im 2/2]
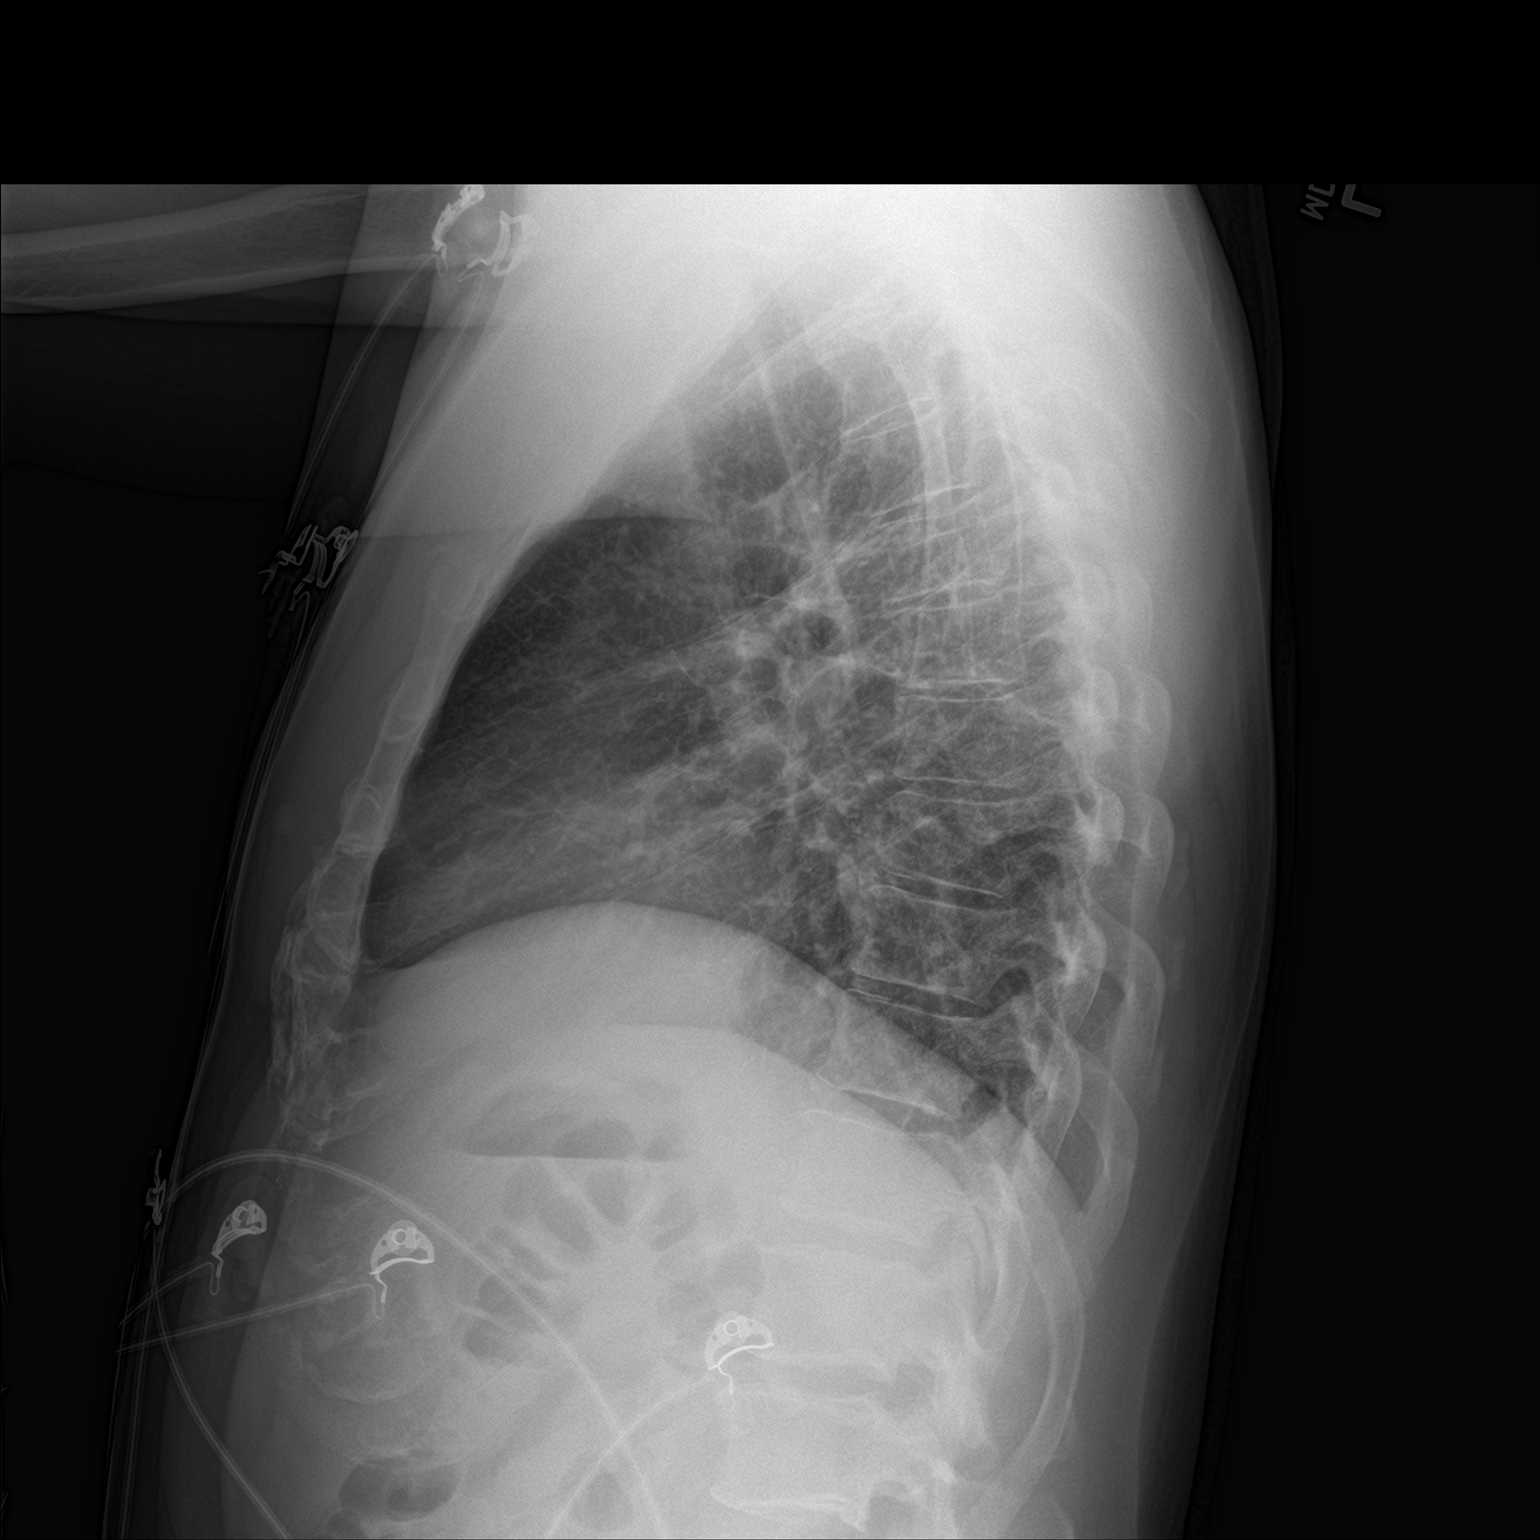

[2 of 2 positions shown; findings below may reference images not displayed]

FINDINGS: There is atelectatic change in both mid and lower lung zones. There
is no frank edema or consolidation. Heart size and pulmonary
vascularity are normal. No adenopathy. No bone lesions.
IMPRESSION: Patchy atelectasis throughout both lungs. No frank edema or
consolidation. Heart size normal.

## 2020-07-27 IMAGING — CT CT ABD-PELV W/ CM
2 of 5 series · 14 of 46 positions shown, 16 images · IV contrast (APPLIED)
Comparison: None.

CLINICAL DATA: Significant epigastric and left upper quadrant pain
in the context of elevated lipase and alcohol abuse. Pain onset this
morning.

EXAM:
CT ABDOMEN AND PELVIS WITH CONTRAST
TECHNIQUE: Multidetector CT imaging of the abdomen and pelvis was performed
using the standard protocol following bolus administration of
intravenous contrast.
CONTRAST:  100mL Y3ZW6T-AOO IOPAMIDOL (Y3ZW6T-AOO) INJECTION 61%

[Series 2: routine abd/pel with · axial · 0.67mm/px · z∈[-653,-208]mm · 11 of 101 slices shown, 13 images]
[im 6/101  soft-tissue]
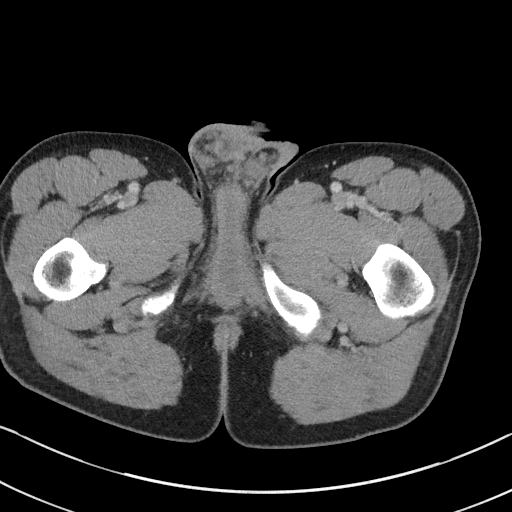
[im 6/101  bone]
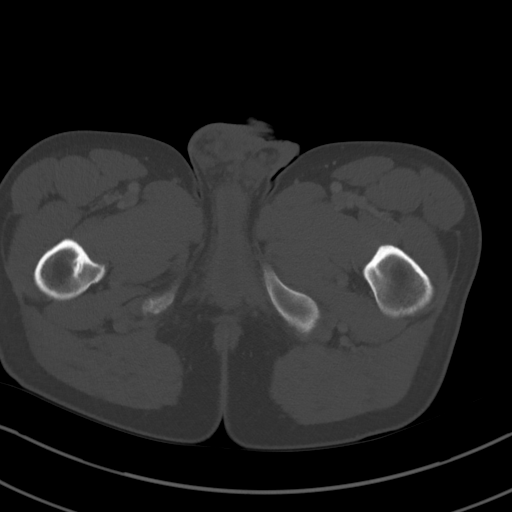
[im 17/101  soft-tissue]
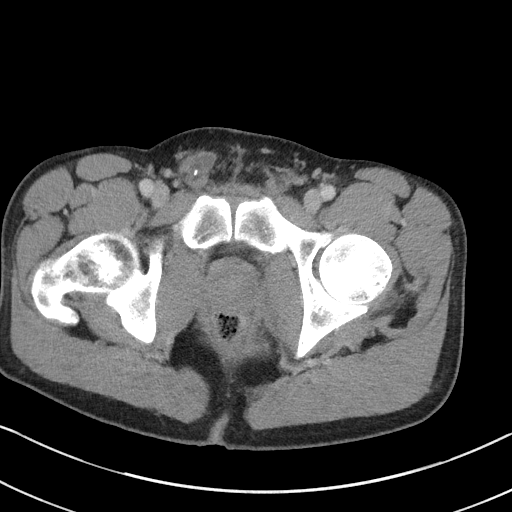
[im 23/101  soft-tissue]
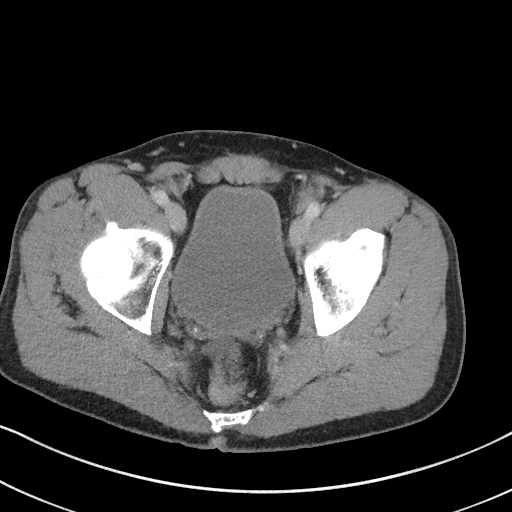
[im 34/101  soft-tissue]
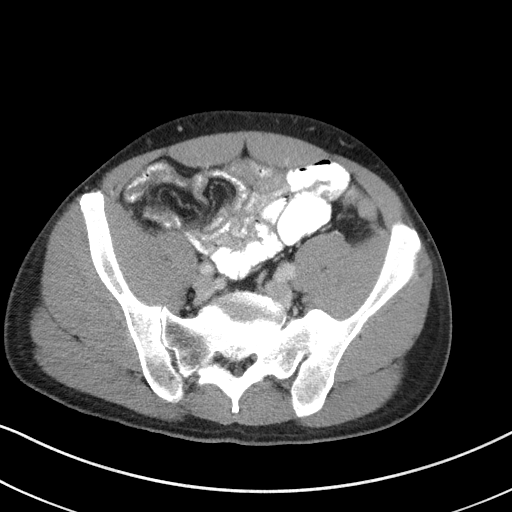
[im 39/101  soft-tissue]
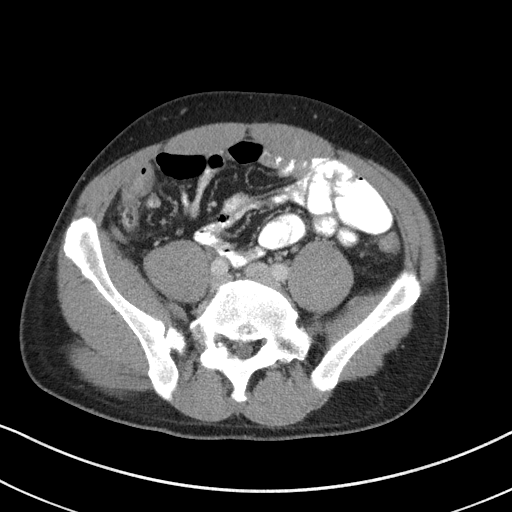
[im 51/101  soft-tissue]
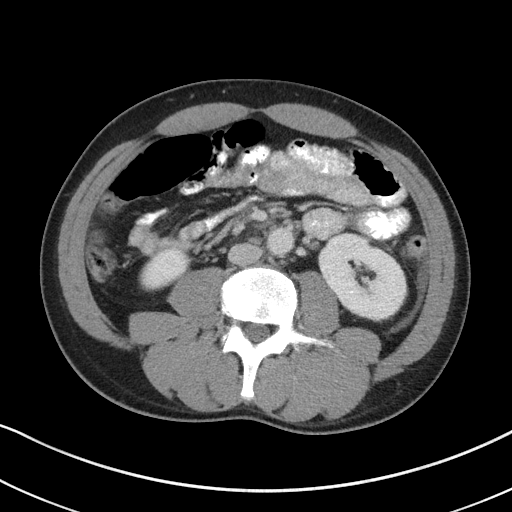
[im 62/101  soft-tissue]
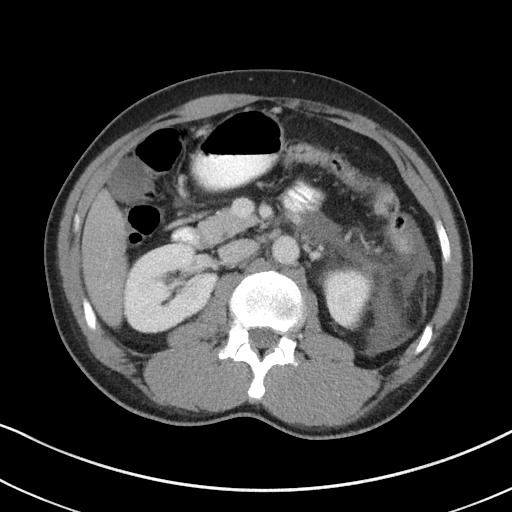
[im 67/101  soft-tissue]
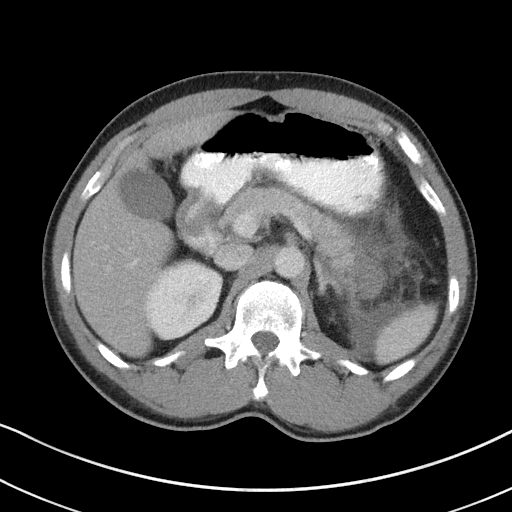
[im 78/101  soft-tissue]
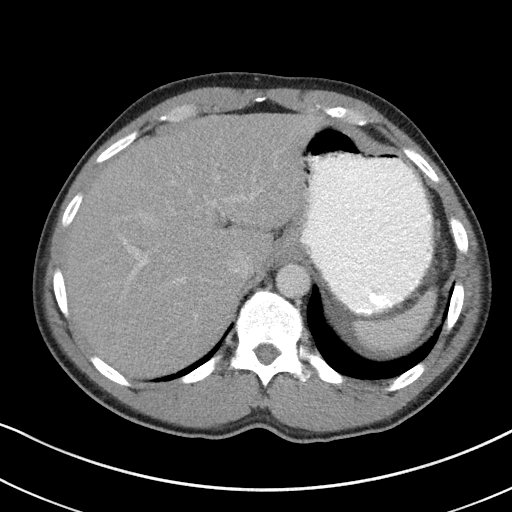
[im 78/101  bone]
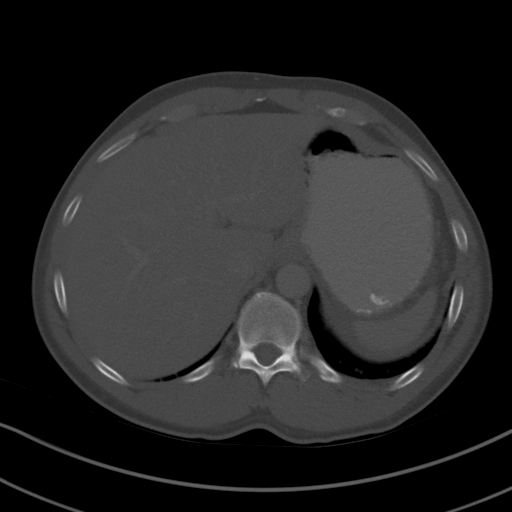
[im 84/101  soft-tissue]
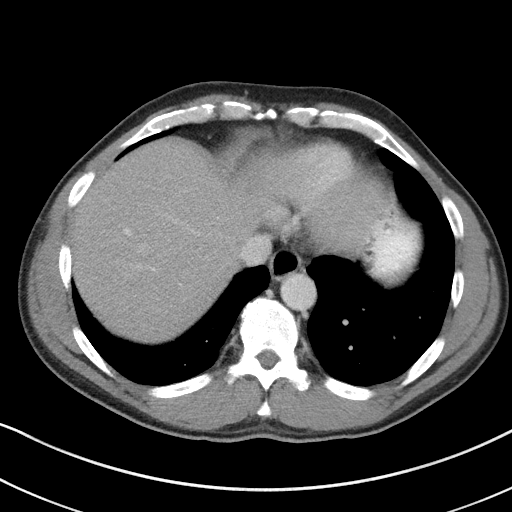
[im 95/101  soft-tissue]
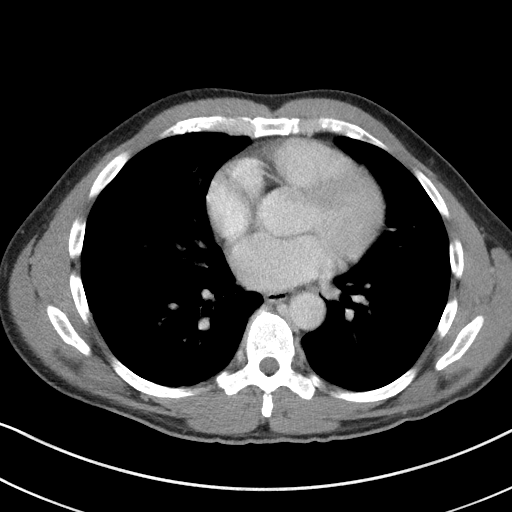

[Series 5: coronal st · coronal · 0.65mm/px · 3 of 82 slices shown]
[im 28/82  soft-tissue]
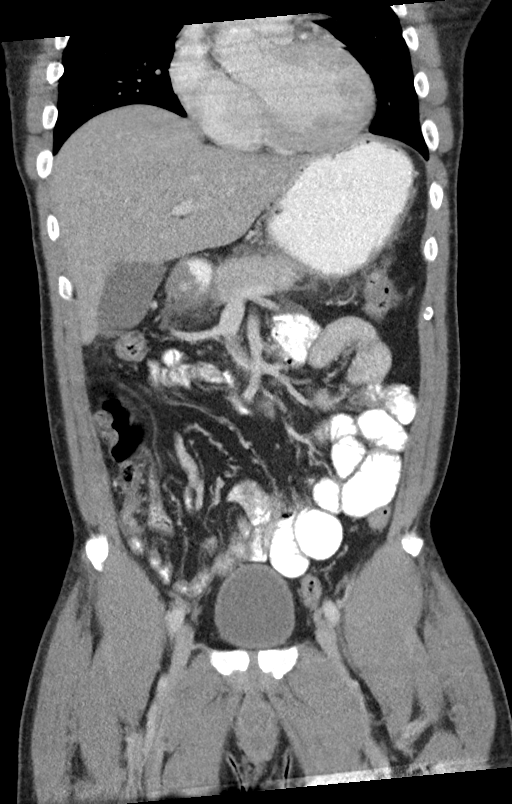
[im 37/82  soft-tissue]
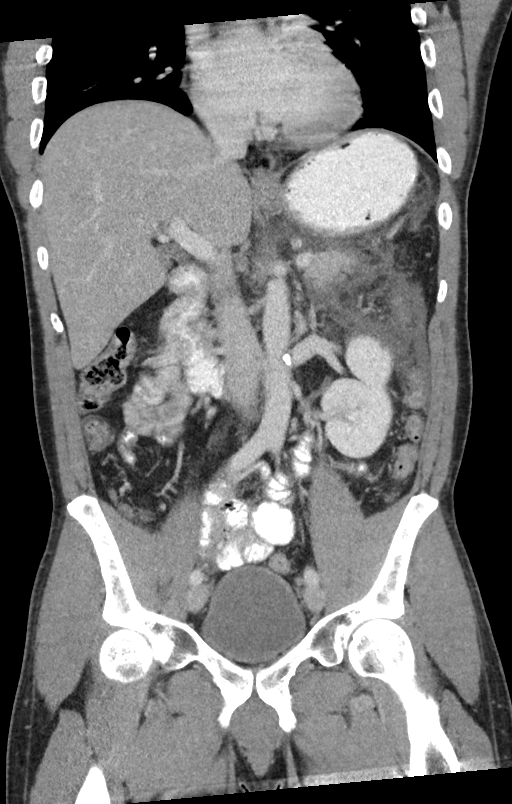
[im 46/82  soft-tissue]
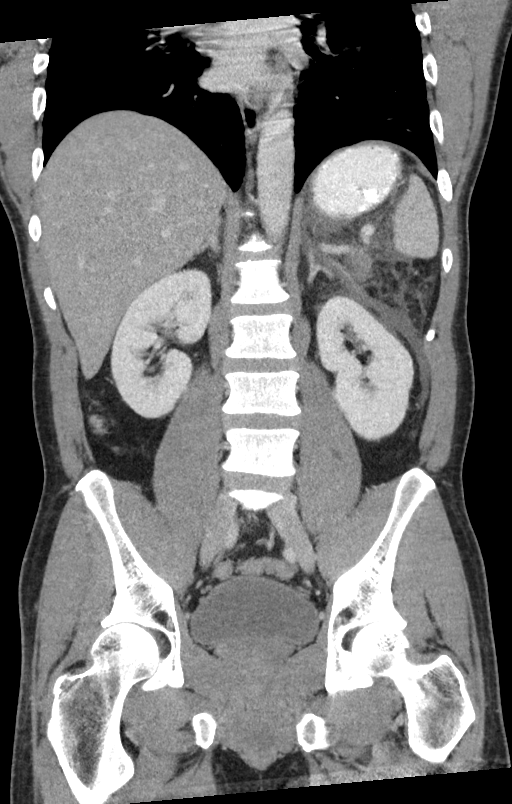

[14 of 46 positions shown; findings below may reference images not displayed]

FINDINGS: Lower chest: Subsegmental atelectasis in both lower lobes. No
confluent airspace disease. No pleural effusion.

Hepatobiliary: Decreased hepatic density consistent with steatosis.
No focal hepatic abnormality. Gallbladder physiologically distended,
no calcified stone. No biliary dilatation.

Pancreas: Peripancreatic fat stranding extending from the mid body
through the tail, at least moderate in degree. Peripancreatic fluid
tracks into the left upper quadrant and left pericolic gutter.
Slightly ovoid decreased density in the region of the pancreatic
tail may be pancreatic fluid or nonenhancement/necrosis, image 34
series 2. The remainder of the pancreas enhances homogeneously. No
pancreatic ductal dilatation.

Spleen: Normal in size without focal abnormality. Perisplenic fluid
tracking from pancreatic inflammation. The splenic vein is patent.

Adrenals/Urinary Tract: Normal adrenal glands. No hydronephrosis or
perinephric edema. Homogeneous renal enhancement with symmetric
excretion on delayed phase imaging. Urinary bladder is
physiologically distended without wall thickening.

Stomach/Bowel: Patulous distal esophagus. Stomach distended with
ingested contrast. No small bowel dilatation, inflammation or
obstruction. Appendix is normal. Mild wall thickening about the
splenic flexure of the colon is likely reactive secondary to
pancreatic inflammation. Small volume of colonic stool.

Vascular/Lymphatic: Mild aortic atherosclerosis. No aneurysm.
Retroaortic left renal vein. Multiple prominent peripancreatic nodes
are likely reactive. The splenic and portal veins are patent.
Mesenteric branches appear patent.

Reproductive: Prostate is unremarkable.

Other: Peripancreatic stranding with free fluid tracking into the
left upper quadrant and left pericolic gutter. Mild amount of
mesenteric edema in the left upper quadrant. Small volume of free
fluid tracks into the pelvis. No free air or organized collection.
Soft tissue thickening in the inguinal canals, greater on the right.

Musculoskeletal: Hemi transitional lumbosacral anatomy with enlarged
left transverse process and pseudoarticulation with the sacrum.
There are no acute or suspicious osseous abnormalities.
IMPRESSION: 1. Acute pancreatitis with moderate peripancreatic inflammation and
free fluid. Slightly ovoid decreased density in the region of the
pancreatic tail may be peripancreatic fluid or
nonenhancement/necrosis. The remainder of the pancreas enhances
homogeneously.
2. Hepatic steatosis.
3. Colonic wall thickening at the splenic flexure is likely
reactive.
4.  Aortic Atherosclerosis ([id number]).

## 2022-09-16 DEATH — deceased
